# Patient Record
Sex: Male | Born: 1955 | Race: White | Hispanic: No | Marital: Married | Smoking: Never smoker
Health system: Southern US, Community
[De-identification: ages and names within clinical notes are randomized; demographics above are authoritative.]

## PROBLEM LIST (undated history)

## (undated) DIAGNOSIS — I7121 Aneurysm of the ascending aorta, without rupture: Secondary | ICD-10-CM

## (undated) DIAGNOSIS — G4730 Sleep apnea, unspecified: Secondary | ICD-10-CM

## (undated) DIAGNOSIS — I10 Essential (primary) hypertension: Secondary | ICD-10-CM

## (undated) DIAGNOSIS — E785 Hyperlipidemia, unspecified: Secondary | ICD-10-CM

## (undated) DIAGNOSIS — Z87442 Personal history of urinary calculi: Secondary | ICD-10-CM

## (undated) DIAGNOSIS — I712 Thoracic aortic aneurysm, without rupture: Secondary | ICD-10-CM

## (undated) HISTORY — PX: SHOULDER SURGERY: SHX246

## (undated) HISTORY — PX: TONSILLECTOMY: SUR1361

## (undated) HISTORY — PX: OTHER SURGICAL HISTORY: SHX169

## (undated) HISTORY — DX: Hyperlipidemia, unspecified: E78.5

## (undated) HISTORY — DX: Aneurysm of the ascending aorta, without rupture: I71.21

## (undated) HISTORY — DX: Thoracic aortic aneurysm, without rupture: I71.2

## (undated) HISTORY — DX: Essential (primary) hypertension: I10

---

## 2017-09-10 DIAGNOSIS — M79675 Pain in left toe(s): Secondary | ICD-10-CM | POA: Diagnosis not present

## 2017-09-10 DIAGNOSIS — L02612 Cutaneous abscess of left foot: Secondary | ICD-10-CM | POA: Diagnosis not present

## 2017-09-10 DIAGNOSIS — L03032 Cellulitis of left toe: Secondary | ICD-10-CM | POA: Diagnosis not present

## 2017-09-25 DIAGNOSIS — L03032 Cellulitis of left toe: Secondary | ICD-10-CM | POA: Diagnosis not present

## 2017-10-16 DIAGNOSIS — D229 Melanocytic nevi, unspecified: Secondary | ICD-10-CM | POA: Diagnosis not present

## 2017-10-16 DIAGNOSIS — L821 Other seborrheic keratosis: Secondary | ICD-10-CM | POA: Diagnosis not present

## 2017-10-16 DIAGNOSIS — Z1283 Encounter for screening for malignant neoplasm of skin: Secondary | ICD-10-CM | POA: Diagnosis not present

## 2017-10-16 DIAGNOSIS — L578 Other skin changes due to chronic exposure to nonionizing radiation: Secondary | ICD-10-CM | POA: Diagnosis not present

## 2017-10-17 DIAGNOSIS — M5432 Sciatica, left side: Secondary | ICD-10-CM | POA: Diagnosis not present

## 2017-11-14 DIAGNOSIS — M545 Low back pain: Secondary | ICD-10-CM | POA: Diagnosis not present

## 2017-11-14 DIAGNOSIS — M5432 Sciatica, left side: Secondary | ICD-10-CM | POA: Diagnosis not present

## 2017-11-14 DIAGNOSIS — M5416 Radiculopathy, lumbar region: Secondary | ICD-10-CM | POA: Diagnosis not present

## 2017-11-14 DIAGNOSIS — S330XXD Traumatic rupture of lumbar intervertebral disc, subsequent encounter: Secondary | ICD-10-CM | POA: Diagnosis not present

## 2017-11-19 DIAGNOSIS — M545 Low back pain: Secondary | ICD-10-CM | POA: Diagnosis not present

## 2017-11-21 DIAGNOSIS — M5416 Radiculopathy, lumbar region: Secondary | ICD-10-CM | POA: Diagnosis not present

## 2017-11-21 DIAGNOSIS — S330XXD Traumatic rupture of lumbar intervertebral disc, subsequent encounter: Secondary | ICD-10-CM | POA: Diagnosis not present

## 2017-11-21 DIAGNOSIS — M545 Low back pain: Secondary | ICD-10-CM | POA: Diagnosis not present

## 2017-11-23 DIAGNOSIS — M5416 Radiculopathy, lumbar region: Secondary | ICD-10-CM | POA: Diagnosis not present

## 2017-11-23 DIAGNOSIS — S330XXD Traumatic rupture of lumbar intervertebral disc, subsequent encounter: Secondary | ICD-10-CM | POA: Diagnosis not present

## 2017-11-23 DIAGNOSIS — M545 Low back pain: Secondary | ICD-10-CM | POA: Diagnosis not present

## 2017-11-27 DIAGNOSIS — M545 Low back pain: Secondary | ICD-10-CM | POA: Diagnosis not present

## 2017-11-27 DIAGNOSIS — M5416 Radiculopathy, lumbar region: Secondary | ICD-10-CM | POA: Diagnosis not present

## 2017-11-27 DIAGNOSIS — S330XXD Traumatic rupture of lumbar intervertebral disc, subsequent encounter: Secondary | ICD-10-CM | POA: Diagnosis not present

## 2017-11-29 DIAGNOSIS — M5416 Radiculopathy, lumbar region: Secondary | ICD-10-CM | POA: Diagnosis not present

## 2017-11-29 DIAGNOSIS — S330XXD Traumatic rupture of lumbar intervertebral disc, subsequent encounter: Secondary | ICD-10-CM | POA: Diagnosis not present

## 2017-11-29 DIAGNOSIS — M545 Low back pain: Secondary | ICD-10-CM | POA: Diagnosis not present

## 2017-12-03 DIAGNOSIS — M5416 Radiculopathy, lumbar region: Secondary | ICD-10-CM | POA: Diagnosis not present

## 2017-12-03 DIAGNOSIS — S330XXD Traumatic rupture of lumbar intervertebral disc, subsequent encounter: Secondary | ICD-10-CM | POA: Diagnosis not present

## 2017-12-03 DIAGNOSIS — M545 Low back pain: Secondary | ICD-10-CM | POA: Diagnosis not present

## 2017-12-06 DIAGNOSIS — M5416 Radiculopathy, lumbar region: Secondary | ICD-10-CM | POA: Diagnosis not present

## 2017-12-06 DIAGNOSIS — M545 Low back pain: Secondary | ICD-10-CM | POA: Diagnosis not present

## 2017-12-06 DIAGNOSIS — S330XXD Traumatic rupture of lumbar intervertebral disc, subsequent encounter: Secondary | ICD-10-CM | POA: Diagnosis not present

## 2017-12-10 DIAGNOSIS — M5416 Radiculopathy, lumbar region: Secondary | ICD-10-CM | POA: Diagnosis not present

## 2017-12-10 DIAGNOSIS — M545 Low back pain: Secondary | ICD-10-CM | POA: Diagnosis not present

## 2017-12-10 DIAGNOSIS — S330XXD Traumatic rupture of lumbar intervertebral disc, subsequent encounter: Secondary | ICD-10-CM | POA: Diagnosis not present

## 2017-12-11 DIAGNOSIS — M545 Low back pain: Secondary | ICD-10-CM | POA: Diagnosis not present

## 2017-12-11 DIAGNOSIS — M5416 Radiculopathy, lumbar region: Secondary | ICD-10-CM | POA: Diagnosis not present

## 2017-12-11 DIAGNOSIS — M25552 Pain in left hip: Secondary | ICD-10-CM | POA: Diagnosis not present

## 2017-12-20 DIAGNOSIS — S330XXD Traumatic rupture of lumbar intervertebral disc, subsequent encounter: Secondary | ICD-10-CM | POA: Diagnosis not present

## 2017-12-20 DIAGNOSIS — M545 Low back pain: Secondary | ICD-10-CM | POA: Diagnosis not present

## 2017-12-20 DIAGNOSIS — M5416 Radiculopathy, lumbar region: Secondary | ICD-10-CM | POA: Diagnosis not present

## 2017-12-27 DIAGNOSIS — S330XXD Traumatic rupture of lumbar intervertebral disc, subsequent encounter: Secondary | ICD-10-CM | POA: Diagnosis not present

## 2017-12-27 DIAGNOSIS — M5416 Radiculopathy, lumbar region: Secondary | ICD-10-CM | POA: Diagnosis not present

## 2017-12-27 DIAGNOSIS — M545 Low back pain: Secondary | ICD-10-CM | POA: Diagnosis not present

## 2018-01-02 DIAGNOSIS — E785 Hyperlipidemia, unspecified: Secondary | ICD-10-CM | POA: Diagnosis not present

## 2018-01-02 DIAGNOSIS — Z0000 Encounter for general adult medical examination without abnormal findings: Secondary | ICD-10-CM | POA: Diagnosis not present

## 2018-01-02 DIAGNOSIS — Z125 Encounter for screening for malignant neoplasm of prostate: Secondary | ICD-10-CM | POA: Diagnosis not present

## 2018-01-02 DIAGNOSIS — R7301 Impaired fasting glucose: Secondary | ICD-10-CM | POA: Diagnosis not present

## 2018-01-03 DIAGNOSIS — Z0000 Encounter for general adult medical examination without abnormal findings: Secondary | ICD-10-CM | POA: Diagnosis not present

## 2018-01-03 DIAGNOSIS — E785 Hyperlipidemia, unspecified: Secondary | ICD-10-CM | POA: Diagnosis not present

## 2018-01-03 DIAGNOSIS — I10 Essential (primary) hypertension: Secondary | ICD-10-CM | POA: Diagnosis not present

## 2018-01-03 DIAGNOSIS — R7301 Impaired fasting glucose: Secondary | ICD-10-CM | POA: Diagnosis not present

## 2018-02-24 DIAGNOSIS — R05 Cough: Secondary | ICD-10-CM | POA: Diagnosis not present

## 2018-02-24 DIAGNOSIS — R6889 Other general symptoms and signs: Secondary | ICD-10-CM | POA: Diagnosis not present

## 2018-02-24 DIAGNOSIS — J029 Acute pharyngitis, unspecified: Secondary | ICD-10-CM | POA: Diagnosis not present

## 2018-10-09 DIAGNOSIS — I712 Thoracic aortic aneurysm, without rupture: Secondary | ICD-10-CM | POA: Diagnosis not present

## 2018-10-24 DIAGNOSIS — Z23 Encounter for immunization: Secondary | ICD-10-CM | POA: Diagnosis not present

## 2018-12-05 DIAGNOSIS — E669 Obesity, unspecified: Secondary | ICD-10-CM | POA: Diagnosis not present

## 2018-12-05 DIAGNOSIS — G4733 Obstructive sleep apnea (adult) (pediatric): Secondary | ICD-10-CM | POA: Diagnosis not present

## 2018-12-05 DIAGNOSIS — Z1331 Encounter for screening for depression: Secondary | ICD-10-CM | POA: Diagnosis not present

## 2018-12-05 DIAGNOSIS — E7849 Other hyperlipidemia: Secondary | ICD-10-CM | POA: Diagnosis not present

## 2018-12-05 DIAGNOSIS — I10 Essential (primary) hypertension: Secondary | ICD-10-CM | POA: Diagnosis not present

## 2018-12-31 DIAGNOSIS — E7849 Other hyperlipidemia: Secondary | ICD-10-CM | POA: Diagnosis not present

## 2018-12-31 DIAGNOSIS — Z125 Encounter for screening for malignant neoplasm of prostate: Secondary | ICD-10-CM | POA: Diagnosis not present

## 2018-12-31 DIAGNOSIS — Z0000 Encounter for general adult medical examination without abnormal findings: Secondary | ICD-10-CM | POA: Diagnosis not present

## 2019-01-07 DIAGNOSIS — R002 Palpitations: Secondary | ICD-10-CM | POA: Diagnosis not present

## 2019-01-07 DIAGNOSIS — I10 Essential (primary) hypertension: Secondary | ICD-10-CM | POA: Diagnosis not present

## 2019-01-07 DIAGNOSIS — Z0000 Encounter for general adult medical examination without abnormal findings: Secondary | ICD-10-CM | POA: Diagnosis not present

## 2019-01-07 DIAGNOSIS — I719 Aortic aneurysm of unspecified site, without rupture: Secondary | ICD-10-CM | POA: Diagnosis not present

## 2019-01-07 DIAGNOSIS — G4733 Obstructive sleep apnea (adult) (pediatric): Secondary | ICD-10-CM | POA: Diagnosis not present

## 2019-01-08 ENCOUNTER — Ambulatory Visit (INDEPENDENT_AMBULATORY_CARE_PROVIDER_SITE_OTHER): Payer: BC Managed Care – PPO | Admitting: Cardiology

## 2019-01-08 ENCOUNTER — Other Ambulatory Visit: Payer: Self-pay

## 2019-01-08 VITALS — BP 152/86 | HR 70 | Temp 98.8°F | Ht 70.0 in | Wt 231.0 lb

## 2019-01-08 DIAGNOSIS — I712 Thoracic aortic aneurysm, without rupture: Secondary | ICD-10-CM

## 2019-01-08 DIAGNOSIS — I700 Atherosclerosis of aorta: Secondary | ICD-10-CM

## 2019-01-08 DIAGNOSIS — I10 Essential (primary) hypertension: Secondary | ICD-10-CM | POA: Diagnosis not present

## 2019-01-08 DIAGNOSIS — I7121 Aneurysm of the ascending aorta, without rupture: Secondary | ICD-10-CM

## 2019-01-08 MED ORDER — ATENOLOL 50 MG PO TABS: 50.0000 mg | ORAL_TABLET | Freq: Every day | ORAL | 3 refills | Status: DC

## 2019-01-08 NOTE — Progress Notes (Signed)
Patient referred by Charlane Ferretti, DO for hypertension  Subjective:   Alejandro Mccullough, male    DOB: Oct 23, 1955, 63 y.o.   MRN: [id number]   Chief Complaint  Patient presents with  . New Patient (Initial Visit)  . Hypertension    HPI  63 y.o. Caucasian male with thoracic aorta aneurysm, hypertension.  In 2016, patient was diagnosed with thoracic aortic aneurysm during work-up for palpitations.  He has regular follow-up with cardiothoracic surgery at [facility]  He recently underwent CT scan that showed stable ectatic thoracic aorta aneurysm measuring 4.8 cm.  Patient is a retired Recruitment consultant at [employer]  He moved from [place] to [place] in 2018. He exercises regularly, with walking, bicycle, and weight training with 20 lb dumbbells. He denies chest pain, shortness of breath, palpitations, leg edema, orthopnea, PND, TIA/syncope.    Past Medical History:  Diagnosis Date  . Aneurysm of ascending aorta (HCC)   . Hypertension     Past Surgical History:  Procedure Laterality Date  . other     None     Social History   Socioeconomic History  . Marital status: Not on file    Spouse name: Not on file  . Number of children: Not on file  . Years of education: Not on file  . Highest education level: Not on file  Occupational History  . Not on file  Social Needs  . Financial resource strain: Not on file  . Food insecurity    Worry: Not on file    Inability: Not on file  . Transportation needs    Medical: Not on file    Non-medical: Not on file  Tobacco Use  . Smoking status: Not on file  Substance and Sexual Activity  . Alcohol use: Not on file  . Drug use: Not on file  . Sexual activity: Not on file  Lifestyle  . Physical activity    Days per week: Not on file    Minutes per session: Not on file  . Stress: Not on file  Relationships  . Social Musician on phone: Not on file    Gets together: Not on file    Attends religious  service: Not on file    Active member of club or organization: Not on file    Attends meetings of clubs or organizations: Not on file    Relationship status: Not on file  . Intimate partner violence    Fear of current or ex partner: Not on file    Emotionally abused: Not on file    Physically abused: Not on file    Forced sexual activity: Not on file  Other Topics Concern  . Not on file  Social History Narrative  . Not on file     Family History  Problem Relation Age of Onset  . Aortic aneurysm Neg Hx      Current Outpatient Medications on File Prior to Visit  Medication Sig Dispense Refill  . aspirin EC 81 MG tablet Take 81 mg by mouth daily.    Marland Kitchen atorvastatin (LIPITOR) 40 MG tablet daily.    . Cholecalciferol (VITAMIN D3) 125 MCG (5000 UT) TABS Take by mouth daily.    Marland Kitchen losartan (COZAAR) 100 MG tablet Take 100 mg by mouth daily.    . Methylcellulose, Laxative, (CITRUCEL) 500 MG TABS Take 2 tablets by mouth daily. Citrucel    . triamcinolone cream (KENALOG) 0.1 % as needed.  No current facility-administered medications on file prior to visit.     Cardiovascular studies:  EKG 01/08/2019: Sinus rhythm 72 bpm.  Incomplete right bundle branch block. Left anterior fascicular block.  Low voltage limb leads.  CTA chest 10/09/2018: Heart/vessels: Normal heart size. Mild coronary arterial calcifications. Aortic valve calcifications. No pericardial effusion. 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 Aortic measurements as follows compared to CT 10/11/2016;  Ascending aorta and the bifurcation of pulmonary artery measures 4.5 cm, ectatic and unchanged Transverse aorta at the brachycephalic branch measures 3.5 cm, unchanged Proximal descending aorta just 2.8 cm, unchanged Distal descending aorta measures 2.5 cm, unchanged. Conclusion: Similar ectatic ascending aorta measuring up to 4.5 cm compared to CT 10/11/2016. Consider  continued annual imaging surveillance.   Recent labs: Not available.  Review of Systems  Constitution: Negative for decreased appetite, malaise/fatigue, weight gain and weight loss.  HENT: Negative for congestion.   Eyes: Negative for visual disturbance.  Cardiovascular: Negative for chest pain, dyspnea on exertion, leg swelling, palpitations and syncope.  Respiratory: Negative for cough.   Endocrine: Negative for cold intolerance.  Hematologic/Lymphatic: Does not bruise/bleed easily.  Skin: Negative for itching and rash.  Musculoskeletal: Negative for myalgias.  Gastrointestinal: Negative for abdominal pain, nausea and vomiting.  Genitourinary: Negative for dysuria.  Neurological: Negative for dizziness and weakness.  Psychiatric/Behavioral: The patient is not nervous/anxious.   All other systems reviewed and are negative.        Vitals:   01/08/19 1502  Temp: 98.8 F (37.1 C)     Body mass index is 33.15 kg/m. Filed Weights   01/08/19 1502  Weight: 231 lb (104.8 kg)     Objective:   Physical Exam  Constitutional: He is oriented to person, place, and time. He appears well-developed and well-nourished. No distress.  HENT:  Head: Normocephalic and atraumatic.  Eyes: Pupils are equal, round, and reactive to light. Conjunctivae are normal.  Neck: No JVD present.  Cardiovascular: Normal rate, regular rhythm and intact distal pulses.  No murmur heard. Pulmonary/Chest: Effort normal and breath sounds normal. He has no wheezes. He has no rales.  Abdominal: Soft. Bowel sounds are normal. There is no rebound.  Musculoskeletal:        General: No edema.  Lymphadenopathy:    He has no cervical adenopathy.  Neurological: He is alert and oriented to person, place, and time. No cranial nerve deficit.  Skin: Skin is warm and dry.  Psychiatric: He has a normal mood and affect.  Nursing note and vitals reviewed.         Assessment & Recommendations:   63 y.o.  Caucasian male with thoracic aorta aneurysm, hypertension.  1. Aneurysm of ascending aorta (HCC) 4.8 cm, stable. Conitnue medical management.  See below. No clear family h/o or physical exam features s/o familial aortopathy. Nonetheless, recommend genetic screening to exclude this.   2. Essential hypertension Continue losartan 100 mg daily. Instead of amlodipine, I recommend atenolol for HR, BP control, and delay progression of aneurysm. 50 mg daily. Avoid weight training >30 lb.  3. Aortic atherosclerosis: Continue Aspirin, statin. Thank you for referring the patient to [place]. Please feel free to contact with any questions.  Nigel Mormon, MD [facility] Pager: [phone] Office: [phone]

## 2019-01-09 ENCOUNTER — Encounter: Payer: Self-pay | Admitting: Cardiology

## 2019-01-09 DIAGNOSIS — I700 Atherosclerosis of aorta: Secondary | ICD-10-CM | POA: Insufficient documentation

## 2019-01-09 DIAGNOSIS — I712 Thoracic aortic aneurysm, without rupture, unspecified: Secondary | ICD-10-CM | POA: Insufficient documentation

## 2019-01-09 DIAGNOSIS — I10 Essential (primary) hypertension: Secondary | ICD-10-CM | POA: Insufficient documentation

## 2019-01-17 ENCOUNTER — Ambulatory Visit (INDEPENDENT_AMBULATORY_CARE_PROVIDER_SITE_OTHER): Payer: BC Managed Care – PPO | Admitting: Cardiology

## 2019-01-17 ENCOUNTER — Other Ambulatory Visit: Payer: Self-pay

## 2019-01-17 ENCOUNTER — Encounter: Payer: Self-pay | Admitting: Cardiology

## 2019-01-17 VITALS — BP 151/68 | HR 68 | Temp 97.0°F | Ht 70.0 in | Wt 227.9 lb

## 2019-01-17 DIAGNOSIS — I712 Thoracic aortic aneurysm, without rupture: Secondary | ICD-10-CM

## 2019-01-17 DIAGNOSIS — I7121 Aneurysm of the ascending aorta, without rupture: Secondary | ICD-10-CM

## 2019-01-17 NOTE — Progress Notes (Signed)
Patient here for genetic testing only 

## 2019-04-11 ENCOUNTER — Ambulatory Visit: Payer: BC Managed Care – PPO | Admitting: Cardiology

## 2019-04-11 ENCOUNTER — Other Ambulatory Visit: Payer: Self-pay

## 2019-04-11 ENCOUNTER — Encounter: Payer: Self-pay | Admitting: Cardiology

## 2019-04-11 VITALS — BP 153/74 | HR 57 | Temp 97.2°F | Ht 70.0 in | Wt 234.6 lb

## 2019-04-11 DIAGNOSIS — I10 Essential (primary) hypertension: Secondary | ICD-10-CM

## 2019-04-11 DIAGNOSIS — I712 Thoracic aortic aneurysm, without rupture, unspecified: Secondary | ICD-10-CM

## 2019-04-11 DIAGNOSIS — I700 Atherosclerosis of aorta: Secondary | ICD-10-CM | POA: Diagnosis not present

## 2019-04-11 MED ORDER — ASPIRIN EC 81 MG PO TBEC: 81.0000 mg | DELAYED_RELEASE_TABLET | Freq: Every day | ORAL | 3 refills | Status: DC

## 2019-04-11 MED ORDER — ATENOLOL 50 MG PO TABS: 50.0000 mg | ORAL_TABLET | Freq: Every day | ORAL | 3 refills | Status: DC

## 2019-04-11 MED ORDER — ATORVASTATIN CALCIUM 40 MG PO TABS: 40.0000 mg | ORAL_TABLET | Freq: Every day | ORAL | 3 refills | Status: DC

## 2019-04-11 MED ORDER — AMLODIPINE BESYLATE 5 MG PO TABS: 5.0000 mg | ORAL_TABLET | Freq: Every day | ORAL | 3 refills | Status: DC

## 2019-04-11 MED ORDER — LOSARTAN POTASSIUM 100 MG PO TABS: 100.0000 mg | ORAL_TABLET | Freq: Every day | ORAL | 3 refills | Status: DC

## 2019-04-11 NOTE — Progress Notes (Signed)
Patient referred by Charlane Ferretti, DO for hypertension  Subjective:   Alejandro Mccullough, male    DOB: [birth year], 64 y.o.   MRN: [id number]   Chief Complaint  Patient presents with  . Hypertension  . aorta aneurysm  . Follow-up    3 month    HPI   64 y.o. Caucasian male with thoracic aorta aneurysm, hypertension, aortic atherosclerosis.  At last visit 6 months ago, I started the patient on atenolol for better blood pressure and heart rate control in the setting of known thoracic aortic aneurysm.  Also perform genetic screening to evaluate for familial aortopathy.  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  He does not have any symptoms related to this, although my screening is limited.  His blood pressure improved after starting atenolol.  However, over the last few weeks, he has noticed upward trend.   Initial consultation HPI 12/2018: In 2016, patient was diagnosed with thoracic aortic aneurysm during work-up for palpitations.  He has regular follow-up with cardiothoracic surgery at [facility].  He recently underwent CT scan that showed stable ectatic thoracic aorta aneurysm measuring 4.8 cm.  Patient is a retired Recruitment consultant at [employer]  He moved from [place] to [place] in 2018. He exercises regularly, with walking, bicycle, and weight training with 20 lb dumbbells. He denies chest pain, shortness of breath, palpitations, leg edema, orthopnea, PND, TIA/syncope.    Current Outpatient Medications on File Prior to Visit  Medication Sig Dispense Refill  . aspirin EC 81 MG tablet Take 81 mg by mouth daily.    Marland Kitchen atenolol (TENORMIN) 50 MG tablet Take 1 tablet (50 mg total) by mouth daily. 30 tablet 3  . atorvastatin (LIPITOR) 40 MG tablet daily.    . Cholecalciferol (VITAMIN D3) 125 MCG (5000 UT) TABS Take  by mouth daily.    Marland Kitchen losartan (COZAAR) 100 MG tablet Take 100 mg by mouth daily.    . Methylcellulose, Laxative, (CITRUCEL) 500 MG TABS Take 2 tablets by mouth daily. Citrucel    . triamcinolone cream (KENALOG) 0.1 % as needed.     No current facility-administered medications on file prior to visit.    Cardiovascular studies:  EKG 01/08/2019: Sinus rhythm 72 bpm.  Incomplete right bundle branch block. Left anterior fascicular block.  Low voltage limb leads.  CTA chest 10/09/2018: Heart/vessels: Normal heart size. Mild coronary arterial calcifications. Aortic valve calcifications. No pericardial effusion. 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 Aortic measurements as follows compared to CT 10/11/2016;  Ascending aorta and the bifurcation of pulmonary artery measures 4.5 cm, ectatic and unchanged Transverse aorta at the brachycephalic branch measures 3.5 cm, unchanged Proximal descending aorta just 2.8 cm, unchanged Distal descending aorta measures 2.5 cm, unchanged. Conclusion: Similar ectatic ascending aorta measuring up to 4.5 cm compared to CT 10/11/2016. Consider continued annual imaging surveillance.   Recent labs: Not available.  Review of Systems  Cardiovascular: Negative for chest pain, dyspnea on exertion, leg swelling, palpitations and syncope.         Vitals:   04/11/19 0923  BP: (!) 153/74  Pulse: (!) 57  Temp: (!) 97.2 F (36.2 C)  SpO2: 99%     Body mass index is 33.66 kg/m. Filed Weights   04/11/19 0923  Weight: 234 lb 9.6 oz (106.4 kg)  Objective:   Physical Exam  Constitutional: He appears well-developed and well-nourished.  Neck: No JVD present.  Cardiovascular: Normal rate, regular rhythm, normal heart sounds and intact distal pulses.  No murmur heard. Pulmonary/Chest: Effort normal and breath sounds normal. He has no wheezes. He has no rales.  Musculoskeletal:        General: No  edema.  Nursing note and vitals reviewed.         Assessment & Recommendations:   64 y.o. Caucasian male with thoracic aorta aneurysm, hypertension, aortic atherosclerosis  1. Aneurysm of ascending aorta (HCC) 4.8 cm, stable. Conitnue medical management.  No clear family h/o or physical exam features s/o familial aortopathy. Repeat CT angiogram aorta in August 2021. Genetic screening negative for familial aortopathy. 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  Offered referral to genetic counseling.  Patient would like to hold off for now.   2. Essential hypertension Suboptimal control.  Restarted amlodipine 5 mg daily.  Continue losartan 100 mg, atenolol 50 mg daily.    3. Aortic atherosclerosis: Continue Aspirin, statin. Check BMP and lipid panel in August 2021.  Follow-up in 6 months.  Nigel Mormon, MD [facility]. [place] Pager: [phone] Office: [phone]

## 2019-04-12 ENCOUNTER — Encounter: Payer: Self-pay | Admitting: Cardiology

## 2019-05-01 NOTE — Addendum Note (Signed)
Addended by: Elder Negus on: 05/01/2019 09:21 AM   Modules accepted: Orders

## 2019-08-29 ENCOUNTER — Emergency Department (HOSPITAL_COMMUNITY): Payer: BC Managed Care – PPO

## 2019-08-29 ENCOUNTER — Emergency Department (HOSPITAL_COMMUNITY)
Admission: EM | Admit: 2019-08-29 | Discharge: 2019-08-29 | Disposition: A | Discharge status: Home / Self Care | Payer: BC Managed Care – PPO | Attending: Emergency Medicine | Admitting: Emergency Medicine

## 2019-08-29 ENCOUNTER — Other Ambulatory Visit: Payer: Self-pay

## 2019-08-29 DIAGNOSIS — N132 Hydronephrosis with renal and ureteral calculous obstruction: Secondary | ICD-10-CM | POA: Diagnosis not present

## 2019-08-29 DIAGNOSIS — Z79899 Other long term (current) drug therapy: Secondary | ICD-10-CM | POA: Diagnosis not present

## 2019-08-29 DIAGNOSIS — R109 Unspecified abdominal pain: Secondary | ICD-10-CM | POA: Diagnosis not present

## 2019-08-29 DIAGNOSIS — K5730 Diverticulosis of large intestine without perforation or abscess without bleeding: Secondary | ICD-10-CM | POA: Diagnosis not present

## 2019-08-29 DIAGNOSIS — Z880 Allergy status to penicillin: Secondary | ICD-10-CM | POA: Insufficient documentation

## 2019-08-29 DIAGNOSIS — Z7982 Long term (current) use of aspirin: Secondary | ICD-10-CM | POA: Insufficient documentation

## 2019-08-29 DIAGNOSIS — K8080 Other cholelithiasis without obstruction: Secondary | ICD-10-CM | POA: Diagnosis not present

## 2019-08-29 DIAGNOSIS — I700 Atherosclerosis of aorta: Secondary | ICD-10-CM | POA: Diagnosis not present

## 2019-08-29 DIAGNOSIS — I10 Essential (primary) hypertension: Secondary | ICD-10-CM | POA: Insufficient documentation

## 2019-08-29 DIAGNOSIS — N200 Calculus of kidney: Secondary | ICD-10-CM | POA: Insufficient documentation

## 2019-08-29 LAB — COMPREHENSIVE METABOLIC PANEL
ALT: 44 U/L (ref 0–44)
AST: 40 U/L (ref 15–41)
Albumin: 4.5 g/dL (ref 3.5–5.0)
Alkaline Phosphatase: 95 U/L (ref 38–126)
Anion gap: 14 (ref 5–15)
BUN: 14 mg/dL (ref 8–23)
CO2: 20 mmol/L — ABNORMAL LOW (ref 22–32)
Calcium: 9.1 mg/dL (ref 8.9–10.3)
Chloride: 104 mmol/L (ref 98–111)
Creatinine, Ser: 1.2 mg/dL (ref 0.61–1.24)
GFR calc Af Amer: >60 mL/min (ref >60)
GFR calc non Af Amer: >60 mL/min (ref >60)
Glucose, Bld: 147 mg/dL — ABNORMAL HIGH (ref 70–99)
Potassium: 4.5 mmol/L (ref 3.5–5.1)
Sodium: 138 mmol/L (ref 135–145)
Total Bilirubin: 0.6 mg/dL (ref 0.3–1.2)
Total Protein: 7.2 g/dL (ref 6.5–8.1)

## 2019-08-29 LAB — LIPASE, BLOOD: Lipase: 30 U/L (ref 11–51)

## 2019-08-29 LAB — CBC
HCT: 45.1 % (ref 39.0–52.0)
Hemoglobin: 15.5 g/dL (ref 13.0–17.0)
MCH: 33.2 pg (ref 26.0–34.0)
MCHC: 34.4 g/dL (ref 30.0–36.0)
MCV: 96.6 fL (ref 80.0–100.0)
Platelets: 231 10*3/uL (ref 150–400)
RBC: 4.67 MIL/uL (ref 4.22–5.81)
RDW: 12.8 % (ref 11.5–15.5)
WBC: 9 10*3/uL (ref 4.0–10.5)
nRBC: 0 % (ref 0.0–0.2)

## 2019-08-29 LAB — URINALYSIS, ROUTINE W REFLEX MICROSCOPIC
Bacteria, UA: NONE SEEN
Bilirubin Urine: NEGATIVE
Glucose, UA: NEGATIVE mg/dL
Ketones, ur: NEGATIVE mg/dL
Leukocytes,Ua: NEGATIVE
Nitrite: NEGATIVE
Protein, ur: NEGATIVE mg/dL
Specific Gravity, Urine: 1.023 (ref 1.005–1.030)
pH: 5 (ref 5.0–8.0)

## 2019-08-29 MED ORDER — ONDANSETRON HCL 4 MG PO TABS
4.0000 mg | ORAL_TABLET | Freq: Four times a day (QID) | ORAL | 0 refills | Status: DC
Start: 2019-08-29 — End: 2019-10-24

## 2019-08-29 MED ORDER — TAMSULOSIN HCL 0.4 MG PO CAPS
0.4000 mg | ORAL_CAPSULE | Freq: Once | ORAL | Status: AC
  Administered 2019-08-29: 0.4 mg via ORAL
  Filled 2019-08-29: qty 1

## 2019-08-29 MED ORDER — SODIUM CHLORIDE 0.9% FLUSH
3.0000 mL | Freq: Once | INTRAVENOUS | Status: AC
  Administered 2019-08-29: 3 mL via INTRAVENOUS

## 2019-08-29 MED ORDER — MORPHINE SULFATE (PF) 4 MG/ML IV SOLN
4.0000 mg | Freq: Once | INTRAVENOUS | Status: AC
  Administered 2019-08-29: 4 mg via INTRAVENOUS
  Filled 2019-08-29: qty 1

## 2019-08-29 MED ORDER — TAMSULOSIN HCL 0.4 MG PO CAPS: 0.4000 mg | ORAL_CAPSULE | Freq: Every day | ORAL | 0 refills | Status: AC

## 2019-08-29 MED ORDER — ONDANSETRON 4 MG PO TBDP: 4.0000 mg | ORAL_TABLET | Freq: Once | ORAL | Status: DC | PRN

## 2019-08-29 MED ORDER — ONDANSETRON HCL 4 MG/2ML IJ SOLN
4.0000 mg | Freq: Once | INTRAMUSCULAR | Status: AC
  Administered 2019-08-29: 4 mg via INTRAVENOUS
  Filled 2019-08-29: qty 2

## 2019-08-29 MED ORDER — HYDROCODONE-ACETAMINOPHEN 5-325 MG PO TABS: 1.0000 | ORAL_TABLET | Freq: Four times a day (QID) | ORAL | 0 refills | Status: AC | PRN

## 2019-08-29 MED ORDER — KETOROLAC TROMETHAMINE 15 MG/ML IJ SOLN
15.0000 mg | Freq: Once | INTRAMUSCULAR | Status: AC
  Administered 2019-08-29: 15 mg via INTRAVENOUS
  Filled 2019-08-29: qty 1

## 2019-08-29 MED ORDER — SODIUM CHLORIDE 0.9 % IV BOLUS (SEPSIS)
1000.0000 mL | Freq: Once | INTRAVENOUS | Status: AC
  Administered 2019-08-29: 1000 mL via INTRAVENOUS

## 2019-08-29 NOTE — Discharge Instructions (Signed)
You have a 77mm x 13mm obstructing kidney stone on the right. We are giving you medications to help with pain and to help pass the stone. Call urology tomorrow to schedule follow up. Thank you for allowing me to care for you today. Please return to the emergency department if you have new or worsening symptoms. Take your medications as instructed.

## 2019-08-29 NOTE — ED Triage Notes (Signed)
Patient complaining of abdominal and back pain that started 1 am. Patient states he is having nausea and chills.

## 2019-08-29 NOTE — ED Provider Notes (Signed)
[facility]-EMERGENCY DEPT Provider Note   CSN: [id number] Arrival date & time: 08/29/19  0341     History Chief Complaint  Patient presents with  . Abdominal Pain    Alejandro Mccullough is a 64 y.o. male.  Patient is a 64 year old male who has past medical history of hypertension, hyperlipidemia, kidney stones, aortic aneurysm presenting to the emergency department for acute onset of right-sided flank pain radiating into the groin which woke him up from sleep this morning.  He reports that this feels similar to when he had kidney stones in the past.  Denies any fever, chills, dysuria,..  He is having nausea        Past Medical History:  Diagnosis Date  . Aneurysm of ascending aorta (HCC)   . Hyperlipidemia   . Hypertension     Patient Active Problem List   Diagnosis Date Noted  . Essential hypertension 01/09/2019  . Thoracic aortic aneurysm without rupture (HCC) 01/09/2019  . Aortic atherosclerosis (HCC) 01/09/2019    Past Surgical History:  Procedure Laterality Date  . other     None  . TONSILLECTOMY     at the age of 50       Family History  Problem Relation Age of Onset  . Hypertension Mother   . Aortic aneurysm Neg Hx     Social History   Tobacco Use  . Smoking status: Never Smoker  . Smokeless tobacco: Never Used  Vaping Use  . Vaping Use: Never used  Substance Use Topics  . Alcohol use: Yes    Comment: occ  . Drug use: Never    Home Medications Prior to Admission medications   Medication Sig Start Date End Date Taking? Authorizing Provider  amLODipine (NORVASC) 5 MG tablet Take 1 tablet (5 mg total) by mouth daily. 04/11/19 08/29/19 Yes Patwardhan, Anabel Bene, MD  aspirin EC 81 MG tablet Take 1 tablet (81 mg total) by mouth daily. 04/11/19  Yes Patwardhan, Manish J, MD  atenolol (TENORMIN) 50 MG tablet Take 1 tablet (50 mg total) by mouth daily. 04/11/19 08/29/19 Yes Patwardhan, Manish J, MD  atorvastatin (LIPITOR) 40 MG tablet Take 1  tablet (40 mg total) by mouth daily. 04/11/19  Yes Patwardhan, Manish J, MD  Cholecalciferol (VITAMIN D3) 125 MCG (5000 UT) TABS Take by mouth daily.   Yes [provider]  losartan (COZAAR) 100 MG tablet Take 1 tablet (100 mg total) by mouth daily. 04/11/19  Yes Patwardhan, Manish J, MD  triamcinolone cream (KENALOG) 0.1 % as needed. 01/07/19  Yes [provider]  HYDROcodone-acetaminophen (NORCO/VICODIN) 5-325 MG tablet Take 1 tablet by mouth every 6 (six) hours as needed for up to 2 days for severe pain. 08/29/19 08/31/19  Arlyn Dunning, PA-C  Methylcellulose, Laxative, (CITRUCEL) 500 MG TABS Take 2 tablets by mouth daily. Citrucel    [provider]  ondansetron (ZOFRAN) 4 MG tablet Take 1 tablet (4 mg total) by mouth every 6 (six) hours. 08/29/19   Arlyn Dunning, PA-C  tamsulosin (FLOMAX) 0.4 MG CAPS capsule Take 1 capsule (0.4 mg total) by mouth daily for 5 days. 08/29/19 09/03/19  Arlyn Dunning, PA-C    Allergies    Penicillin g  Review of Systems   Review of Systems  Constitutional: Negative for chills and fever.  Respiratory: Negative for cough and shortness of breath.   Cardiovascular: Negative for chest pain.  Gastrointestinal: Positive for abdominal pain and nausea. Negative for vomiting.  Genitourinary: Positive  for flank pain. Negative for difficulty urinating and dysuria.  Neurological: Negative for dizziness.  All other systems reviewed and are negative.   Physical Exam Updated Vital Signs BP (!) 169/86 (BP Location: Left Arm)   Pulse 73   Temp 97.9 F (36.6 C) (Oral)   Resp 16   Ht 5\' 10"  (1.778 m)   Wt 102.5 kg   SpO2 97%   BMI 32.43 kg/m   Physical Exam Vitals and nursing note reviewed.  Constitutional:      General: He is not in acute distress.    Appearance: Normal appearance. He is well-developed. He is diaphoretic. He is not ill-appearing or toxic-appearing.     Comments: Appears uncomfortable in pain  HENT:     Head:  Normocephalic.  Eyes:     Conjunctiva/sclera: Conjunctivae normal.  Pulmonary:     Effort: Pulmonary effort is normal.  Abdominal:     General: Bowel sounds are normal.     Tenderness: There is no abdominal tenderness. There is right CVA tenderness.  Skin:    General: Skin is warm.  Neurological:     Mental Status: He is alert.  Psychiatric:        Mood and Affect: Mood normal.     ED Results / Procedures / Treatments   Labs (all labs ordered are listed, but only abnormal results are displayed) Labs Reviewed  COMPREHENSIVE METABOLIC PANEL - Abnormal; Notable for the following components:      Result Value   CO2 20 (*)    Glucose, Bld 147 (*)    All other components within normal limits  URINALYSIS, ROUTINE W REFLEX MICROSCOPIC - Abnormal; Notable for the following components:   Hgb urine dipstick LARGE (*)    All other components within normal limits  LIPASE, BLOOD  CBC    EKG None  Radiology CT Renal Stone Study  Result Date: 08/29/2019 CLINICAL DATA:  Flank pain with kidney stone suspected EXAM: CT ABDOMEN AND PELVIS WITHOUT CONTRAST TECHNIQUE: Multidetector CT imaging of the abdomen and pelvis was performed following the standard protocol without IV contrast. COMPARISON:  None. FINDINGS: Lower chest: Partially covered coronary atherosclerosis. Mild subpleural reticulation which is dependent and likely atelectasis. Hepatobiliary: Segmental steatosis in the anterior right lobe liver.Calcified gallstone at the neck of the gallbladder. No evidence of acute inflammation. Pancreas: Unremarkable. Spleen: Unremarkable. Adrenals/Urinary Tract: Negative adrenals. Mild right hydronephrosis due to a proximal ureteral stone which measures 6 x 5 mm on coronal reformats. Three small left renal calculi based on coronal reformats, measuring up to 2 mm. Probable punctate right lower pole calculus. No left hydronephrosis. Unremarkable collapsed bladder. Stomach/Bowel: No obstruction. No  appendicitis. Left colonic diverticulosis Vascular/Lymphatic: No acute vascular abnormality. Atherosclerotic calcification of the aorta and iliacs. No mass or adenopathy. Reproductive:No pathologic findings. Other: No ascites or pneumoperitoneum. Musculoskeletal: No acute abnormalities. Degenerative facet spurring. IMPRESSION: 1. 6 x 5 mm proximal right ureteral calculus with mild hydronephrosis. 2. Small left renal calculi. 3. Cholelithiasis and left colonic diverticulosis. 4. Segmental hepatic steatosis. Electronically Signed   By: 10/30/2019 M.D.   On: 08/29/2019 05:02    Procedures Procedures (including critical care time)  Medications Ordered in ED Medications  ondansetron (ZOFRAN-ODT) disintegrating tablet 4 mg (has no administration in time range)  sodium chloride flush (NS) 0.9 % injection 3 mL (3 mLs Intravenous Given 08/29/19 0432)  ondansetron (ZOFRAN) injection 4 mg (4 mg Intravenous Given 08/29/19 0429)  sodium chloride 0.9 % bolus 1,000 mL (0 mLs  Intravenous Stopped 08/29/19 0531)  ketorolac (TORADOL) 15 MG/ML injection 15 mg (15 mg Intravenous Given 08/29/19 0430)  tamsulosin (FLOMAX) capsule 0.4 mg (0.4 mg Oral Given 08/29/19 0522)  morphine 4 MG/ML injection 4 mg (4 mg Intravenous Given 08/29/19 0522)    ED Course  I have reviewed the triage vital signs and the nursing notes.  Pertinent labs & imaging results that were available during my care of the patient were reviewed by me and considered in my medical decision making (see chart for details).  Clinical Course as of Aug 29 615  Fri Aug 29, 2019  [id number] Patient with hx of kidney stones presenting with R flank pain which woke him from his sleep. Workup reveals hematuria with signs of UTI. Normal renal function and white count. Ct scan with obstructing 46mmx5mm stone on the R. Patient improved with fluids, pain control. Advised to start flomax, pain meds, drink fluids and call urology tomorrow. Advised on strict return precautions    [KM]    Clinical Course User Index [KM] Jeral Pinch   MDM Rules/Calculators/A&P                          Based on review of vitals, medical screening exam, lab work and/or imaging, there does not appear to be an acute, emergent etiology for the patient's symptoms. Counseled pt on good return precautions and encouraged both PCP and ED follow-up as needed.  Prior to discharge, I also discussed incidental imaging findings with patient in detail and advised appropriate, recommended follow-up in detail.  Clinical Impression: 1. Kidney stone     Disposition: Discharge  Prior to providing a prescription for a controlled substance, I independently reviewed the patient's recent prescription history on the [place] Controlled Substance Reporting System. The patient had no recent or regular prescriptions and was deemed appropriate for a brief, less than 3 day prescription of narcotic for acute analgesia.  This note was prepared with assistance of Conservation officer, historic buildings. Occasional wrong-word or sound-a-like substitutions may have occurred due to the inherent limitations of voice recognition software.  Final Clinical Impression(s) / ED Diagnoses Final diagnoses:  Kidney stone    Rx / DC Orders ED Discharge Orders         Ordered    ondansetron (ZOFRAN) 4 MG tablet  Every 6 hours     Discontinue  Reprint     08/29/19 0519    tamsulosin (FLOMAX) 0.4 MG CAPS capsule  Daily     Discontinue  Reprint     08/29/19 0519    HYDROcodone-acetaminophen (NORCO/VICODIN) 5-325 MG tablet  Every 6 hours PRN     Discontinue  Reprint     08/29/19 0519           Arlyn Dunning, PA-C 08/29/19 0617    Nira Conn, MD 08/29/19 [phone]

## 2019-08-31 ENCOUNTER — Encounter (HOSPITAL_COMMUNITY)
Admission: EM | Disposition: A | Discharge status: Home / Self Care | Payer: Self-pay | Source: Home / Self Care | Attending: Emergency Medicine

## 2019-08-31 SURGERY — CYSTOURETEROSCOPY, WITH RETROGRADE PYELOGRAM AND STENT INSERTION
Anesthesia: General

## 2019-09-01 ENCOUNTER — Encounter (HOSPITAL_COMMUNITY)
Admission: EM | Disposition: A | Discharge status: Home / Self Care | Payer: Self-pay | Source: Home / Self Care | Attending: Emergency Medicine

## 2019-09-01 ENCOUNTER — Ambulatory Visit (HOSPITAL_COMMUNITY)
Admission: EM | Admit: 2019-09-01 | Discharge: 2019-09-02 | Disposition: A | Discharge status: Home / Self Care | Payer: BC Managed Care – PPO | Attending: Urology | Admitting: Urology

## 2019-09-01 ENCOUNTER — Emergency Department (HOSPITAL_COMMUNITY): Payer: BC Managed Care – PPO | Admitting: Anesthesiology

## 2019-09-01 ENCOUNTER — Emergency Department (HOSPITAL_COMMUNITY): Payer: BC Managed Care – PPO

## 2019-09-01 ENCOUNTER — Emergency Department (HOSPITAL_COMMUNITY): Payer: BC Managed Care – PPO | Admitting: Certified Registered"

## 2019-09-01 ENCOUNTER — Encounter (HOSPITAL_COMMUNITY): Payer: Self-pay

## 2019-09-01 ENCOUNTER — Other Ambulatory Visit: Payer: Self-pay

## 2019-09-01 DIAGNOSIS — R7989 Other specified abnormal findings of blood chemistry: Secondary | ICD-10-CM

## 2019-09-01 DIAGNOSIS — Z87442 Personal history of urinary calculi: Secondary | ICD-10-CM | POA: Diagnosis not present

## 2019-09-01 DIAGNOSIS — Z03818 Encounter for observation for suspected exposure to other biological agents ruled out: Secondary | ICD-10-CM | POA: Diagnosis not present

## 2019-09-01 DIAGNOSIS — N201 Calculus of ureter: Secondary | ICD-10-CM | POA: Diagnosis not present

## 2019-09-01 DIAGNOSIS — N179 Acute kidney failure, unspecified: Secondary | ICD-10-CM | POA: Diagnosis not present

## 2019-09-01 DIAGNOSIS — I700 Atherosclerosis of aorta: Secondary | ICD-10-CM | POA: Diagnosis not present

## 2019-09-01 DIAGNOSIS — I10 Essential (primary) hypertension: Secondary | ICD-10-CM | POA: Insufficient documentation

## 2019-09-01 DIAGNOSIS — Z880 Allergy status to penicillin: Secondary | ICD-10-CM | POA: Insufficient documentation

## 2019-09-01 DIAGNOSIS — R109 Unspecified abdominal pain: Secondary | ICD-10-CM | POA: Diagnosis not present

## 2019-09-01 DIAGNOSIS — E785 Hyperlipidemia, unspecified: Secondary | ICD-10-CM | POA: Diagnosis not present

## 2019-09-01 DIAGNOSIS — I712 Thoracic aortic aneurysm, without rupture: Secondary | ICD-10-CM | POA: Insufficient documentation

## 2019-09-01 DIAGNOSIS — Z7982 Long term (current) use of aspirin: Secondary | ICD-10-CM | POA: Insufficient documentation

## 2019-09-01 DIAGNOSIS — K8020 Calculus of gallbladder without cholecystitis without obstruction: Secondary | ICD-10-CM | POA: Insufficient documentation

## 2019-09-01 DIAGNOSIS — Z20822 Contact with and (suspected) exposure to covid-19: Secondary | ICD-10-CM | POA: Insufficient documentation

## 2019-09-01 DIAGNOSIS — N132 Hydronephrosis with renal and ureteral calculous obstruction: Secondary | ICD-10-CM | POA: Diagnosis not present

## 2019-09-01 DIAGNOSIS — Z8249 Family history of ischemic heart disease and other diseases of the circulatory system: Secondary | ICD-10-CM | POA: Diagnosis not present

## 2019-09-01 DIAGNOSIS — Z79899 Other long term (current) drug therapy: Secondary | ICD-10-CM | POA: Insufficient documentation

## 2019-09-01 DIAGNOSIS — K760 Fatty (change of) liver, not elsewhere classified: Secondary | ICD-10-CM | POA: Diagnosis not present

## 2019-09-01 HISTORY — PX: CYSTOSCOPY WITH RETROGRADE PYELOGRAM, URETEROSCOPY AND STENT PLACEMENT: SHX5789

## 2019-09-01 LAB — CBC WITH DIFFERENTIAL/PLATELET
Abs Immature Granulocytes: 0.03 10*3/uL (ref 0.00–0.07)
Basophils Absolute: 0 10*3/uL (ref 0.0–0.1)
Basophils Relative: 0 %
Eosinophils Absolute: 0 10*3/uL (ref 0.0–0.5)
Eosinophils Relative: 0 %
HCT: 45.4 % (ref 39.0–52.0)
Hemoglobin: 15.4 g/dL (ref 13.0–17.0)
Immature Granulocytes: 0 %
Lymphocytes Relative: 14 %
Lymphs Abs: 1.5 10*3/uL (ref 0.7–4.0)
MCH: 33.8 pg (ref 26.0–34.0)
MCHC: 33.9 g/dL (ref 30.0–36.0)
MCV: 99.6 fL (ref 80.0–100.0)
Monocytes Absolute: 1.1 10*3/uL — ABNORMAL HIGH (ref 0.1–1.0)
Monocytes Relative: 10 %
Neutro Abs: 7.9 10*3/uL — ABNORMAL HIGH (ref 1.7–7.7)
Neutrophils Relative %: 76 %
Platelets: 237 10*3/uL (ref 150–400)
RBC: 4.56 MIL/uL (ref 4.22–5.81)
RDW: 12.4 % (ref 11.5–15.5)
WBC: 10.5 10*3/uL (ref 4.0–10.5)
nRBC: 0 % (ref 0.0–0.2)

## 2019-09-01 LAB — URINALYSIS, ROUTINE W REFLEX MICROSCOPIC
Bilirubin Urine: NEGATIVE
Glucose, UA: NEGATIVE mg/dL
Ketones, ur: NEGATIVE mg/dL
Nitrite: NEGATIVE
Protein, ur: NEGATIVE mg/dL
RBC / HPF: >50 RBC/hpf — ABNORMAL HIGH (ref 0–5)
Specific Gravity, Urine: 1.016 (ref 1.005–1.030)
pH: 5 (ref 5.0–8.0)

## 2019-09-01 LAB — BASIC METABOLIC PANEL
Anion gap: 10 (ref 5–15)
BUN: 16 mg/dL (ref 8–23)
CO2: 25 mmol/L (ref 22–32)
Calcium: 9.3 mg/dL (ref 8.9–10.3)
Chloride: 103 mmol/L (ref 98–111)
Creatinine, Ser: 1.95 mg/dL — ABNORMAL HIGH (ref 0.61–1.24)
GFR calc Af Amer: 42 mL/min — ABNORMAL LOW (ref >60)
GFR calc non Af Amer: 36 mL/min — ABNORMAL LOW (ref >60)
Glucose, Bld: 101 mg/dL — ABNORMAL HIGH (ref 70–99)
Potassium: 4.5 mmol/L (ref 3.5–5.1)
Sodium: 138 mmol/L (ref 135–145)

## 2019-09-01 LAB — SARS CORONAVIRUS 2 BY RT PCR (HOSPITAL ORDER, PERFORMED IN ~~LOC~~ HOSPITAL LAB): SARS Coronavirus 2: NEGATIVE

## 2019-09-01 SURGERY — CYSTOURETEROSCOPY, WITH RETROGRADE PYELOGRAM AND STENT INSERTION
Anesthesia: General | Site: Ureter | Laterality: Right

## 2019-09-01 MED ORDER — MORPHINE SULFATE (PF) 4 MG/ML IV SOLN
4.0000 mg | Freq: Once | INTRAVENOUS | Status: AC
  Administered 2019-09-01: 4 mg via INTRAVENOUS
  Filled 2019-09-01: qty 1

## 2019-09-01 MED ORDER — SODIUM CHLORIDE 0.9 % IV SOLN
2.0000 g | INTRAVENOUS | Status: AC
  Administered 2019-09-01: 2 g via INTRAVENOUS
  Filled 2019-09-01: qty 2

## 2019-09-01 MED ORDER — FENTANYL CITRATE (PF) 100 MCG/2ML IJ SOLN
INTRAMUSCULAR | Status: AC
  Filled 2019-09-01: qty 2

## 2019-09-01 MED ORDER — MIDAZOLAM HCL 2 MG/2ML IJ SOLN
INTRAMUSCULAR | Status: AC
  Filled 2019-09-01: qty 2

## 2019-09-01 MED ORDER — PROPOFOL 10 MG/ML IV BOLUS
INTRAVENOUS | Status: AC
  Filled 2019-09-01: qty 20

## 2019-09-01 MED ORDER — ONDANSETRON HCL 4 MG/2ML IJ SOLN
4.0000 mg | Freq: Once | INTRAMUSCULAR | Status: AC
  Administered 2019-09-01: 4 mg via INTRAVENOUS
  Filled 2019-09-01: qty 2

## 2019-09-01 MED ORDER — SODIUM CHLORIDE 0.9 % IV BOLUS
1000.0000 mL | Freq: Once | INTRAVENOUS | Status: AC
  Administered 2019-09-01: 1000 mL via INTRAVENOUS

## 2019-09-01 SURGICAL SUPPLY — 25 items
BAG URO CATCHER STRL LF (MISCELLANEOUS) ×3 IMPLANT
BASKET STONE NCOMPASS (UROLOGICAL SUPPLIES) IMPLANT
CATH URET 5FR 28IN OPEN ENDED (CATHETERS) ×3 IMPLANT
CATH URET DUAL LUMEN 6-10FR 50 (CATHETERS) IMPLANT
CLOTH BEACON ORANGE TIMEOUT ST (SAFETY) ×3 IMPLANT
EXTRACTOR STONE NITINOL NGAGE (UROLOGICAL SUPPLIES) IMPLANT
FIBER LASER FLEXIVA 1000 (UROLOGICAL SUPPLIES) IMPLANT
FIBER LASER FLEXIVA 365 (UROLOGICAL SUPPLIES) IMPLANT
FIBER LASER FLEXIVA 550 (UROLOGICAL SUPPLIES) IMPLANT
FIBER LASER TRAC TIP (UROLOGICAL SUPPLIES) IMPLANT
GLOVE BIOGEL PI IND STRL 7.5 (GLOVE) ×1 IMPLANT
GLOVE BIOGEL PI IND STRL 8 (GLOVE) ×2 IMPLANT
GLOVE BIOGEL PI INDICATOR 7.5 (GLOVE) ×2
GLOVE BIOGEL PI INDICATOR 8 (GLOVE) ×4
GLOVE SURG SS PI 8.0 STRL IVOR (GLOVE) ×3 IMPLANT
GOWN STRL REUS W/TWL XL LVL3 (GOWN DISPOSABLE) ×3 IMPLANT
GUIDEWIRE STR DUAL SENSOR (WIRE) ×6 IMPLANT
KIT TURNOVER KIT A (KITS) ×3 IMPLANT
MANIFOLD NEPTUNE II (INSTRUMENTS) ×3 IMPLANT
PACK CYSTO (CUSTOM PROCEDURE TRAY) ×3 IMPLANT
SHEATH URETERAL 12FRX35CM (MISCELLANEOUS) ×3 IMPLANT
STENT URET 6FRX26 CONTOUR (STENTS) ×3 IMPLANT
TUBING CONNECTING 10 (TUBING) ×2 IMPLANT
TUBING CONNECTING 10' (TUBING) ×1
TUBING UROLOGY SET (TUBING) ×3 IMPLANT

## 2019-09-01 NOTE — ED Triage Notes (Signed)
Patient ac/o right flank pain that is radiating into the right abdomen. Patient has a know kidney stone. Patient states he has been taking hydrocodone which has been working, but he took one at 1045 today with no relief. Patient denies any problems with urination.

## 2019-09-01 NOTE — H&P (View-Only) (Signed)
Subjective: 1. Ureteral stone with hydronephrosis   2. Elevated serum creatinine     CC: Right flank pain.  Hx: Alejandro Mccullough is a 64 yo male who I was asked to see in consultation by Dr. Darl Householder for a 60m right proximal stone with pain that began on Friday.  This is his second trip to the OR and the stone has not progressed.  He had nausea on Friday but none since.  He has had no hematuria or dysuria.  He has some frequency with hydration.  He has AKI with a Cr of 1.95.  He has a history of a prior stone that he passed in 2017.  He has had no prior GU history or surgery.   His pain is well controlled with pain med currently.   ROS:  Review of Systems  Constitutional: Negative for chills and fever.  Respiratory: Negative for shortness of breath.   Cardiovascular: Negative for chest pain.  Gastrointestinal: Positive for abdominal pain and nausea.  Genitourinary: Positive for flank pain and frequency. Negative for hematuria.  All other systems reviewed and are negative.   Allergies  Allergen Reactions   Penicillin G Rash    Past Medical History:  Diagnosis Date   Aneurysm of ascending aorta (HCC)    Hyperlipidemia    Hypertension    Renal disorder     Past Surgical History:  Procedure Laterality Date   other     None   TONSILLECTOMY     at the age of 89   Social History   Socioeconomic History   Marital status: Married    Spouse name: Not on file   Number of children: 1   Years of education: Not on file   Highest education level: Not on file  Occupational History   Not on file  Tobacco Use   Smoking status: Never Smoker   Smokeless tobacco: Never Used  Vaping Use   Vaping Use: Never used  Substance and Sexual Activity   Alcohol use: Yes    Comment: occ   Drug use: Never   Sexual activity: Not on file  Other Topics Concern   Not on file  Social History Narrative   Not on file   Social Determinants of Health   Financial Resource Strain:     Difficulty of Paying Living Expenses:   Food Insecurity:    Worried About RCharity fundraiserin the Last Year:    RArboriculturistin the Last Year:   Transportation Needs:    LFilm/video editor(Medical):    Lack of Transportation (Non-Medical):   Physical Activity:    Days of Exercise per Week:    Minutes of Exercise per Session:   Stress:    Feeling of Stress :   Social Connections:    Frequency of Communication with Friends and Family:    Frequency of Social Gatherings with Friends and Family:    Attends Religious Services:    Active Member of Clubs or Organizations:    Attends CMusic therapist    Marital Status:   Intimate Partner Violence:    Fear of Current or Ex-Partner:    Emotionally Abused:    Physically Abused:    Sexually Abused:     Family History  Problem Relation Age of Onset   Hypertension Mother    Aortic aneurysm Neg Hx     Anti-infectives: Anti-infectives (From admission, onward)   None      No  facility-administered medications for this encounter.  ° °Current Outpatient Medications  °Medication Sig Dispense Refill  °• amLODipine (NORVASC) 5 MG tablet Take 1 tablet (5 mg total) by mouth daily. 180 tablet 3  °• aspirin EC 81 MG tablet Take 1 tablet (81 mg total) by mouth daily. 90 tablet 3  °• atenolol (TENORMIN) 50 MG tablet Take 1 tablet (50 mg total) by mouth daily. 90 tablet 3  °• atorvastatin (LIPITOR) 40 MG tablet Take 1 tablet (40 mg total) by mouth daily. 90 tablet 3  °• Cholecalciferol (VITAMIN D3) 125 MCG (5000 UT) TABS Take by mouth daily.    °• HYDROcodone-acetaminophen (NORCO/VICODIN) 5-325 MG tablet Take 1 tablet by mouth every 6 (six) hours as needed for moderate pain.    °• losartan (COZAAR) 100 MG tablet Take 1 tablet (100 mg total) by mouth daily. 90 tablet 3  °• Methylcellulose, Laxative, (CITRUCEL) 500 MG TABS Take 2 tablets by mouth daily. Citrucel    °• ondansetron (ZOFRAN) 4 MG tablet Take 1  tablet (4 mg total) by mouth every 6 (six) hours. (Patient taking differently: Take 4 mg by mouth 4 (four) times daily as needed for nausea or vomiting. ) 12 tablet 0  °• tamsulosin (FLOMAX) 0.4 MG CAPS capsule Take 1 capsule (0.4 mg total) by mouth daily for 5 days. 5 capsule 0  °• triamcinolone cream (KENALOG) 0.1 % Apply 1 application topically as needed.     ° ° ° °Objective: °Vital signs in last 24 hours: °BP (!) 147/75    Pulse 65    Temp 98.3 °F (36.8 °C) (Oral)    Resp 15    Ht 5' 10" (1.778 m)    Wt 102.5 kg    SpO2 96%    BMI 32.43 kg/m²  ° °Intake/Output from previous day: °No intake/output data recorded. °Intake/Output this shift: °Total I/O °In: 1000 [IV Piggyback:1000] °Out: -  ° ° °Physical Exam °Vitals reviewed.  °Constitutional:   °   Appearance: Normal appearance. He is obese.  °Cardiovascular:  °   Rate and Rhythm: Normal rate and regular rhythm.  °   Heart sounds: Normal heart sounds.  °Pulmonary:  °   Effort: Pulmonary effort is normal. No respiratory distress.  °   Breath sounds: Normal breath sounds.  °Abdominal:  °   Palpations: Abdomen is soft.  °   Tenderness: There is right CVA tenderness.  °Musculoskeletal:     °   General: No swelling. Normal range of motion.  °   Cervical back: Normal range of motion and neck supple.  °Skin: °   General: Skin is warm and dry.  °Neurological:  °   General: No focal deficit present.  °   Mental Status: He is alert and oriented to person, place, and time.  °Psychiatric:     °   Mood and Affect: Mood normal.  ° ° ° °Lab Results:  °Results for orders placed or performed during the hospital encounter of 09/01/19 (from the past 24 hour(s))  °Urinalysis, Routine w reflex microscopic- may I&O cath if menses     Status: Abnormal  ° Collection Time: 09/01/19  4:18 PM  °Result Value Ref Range  ° Color, Urine YELLOW YELLOW  ° APPearance CLEAR CLEAR  ° Specific Gravity, Urine 1.016 1.005 - 1.030  ° pH 5.0 5.0 - 8.0  ° Glucose, UA NEGATIVE NEGATIVE mg/dL  ° Hgb urine  dipstick LARGE (A) NEGATIVE  ° Bilirubin Urine NEGATIVE NEGATIVE  ° Ketones, ur   NEGATIVE NEGATIVE mg/dL  ° Protein, ur NEGATIVE NEGATIVE mg/dL  ° Nitrite NEGATIVE NEGATIVE  ° Leukocytes,Ua TRACE (A) NEGATIVE  ° RBC / HPF >50 (H) 0 - 5 RBC/hpf  ° WBC, UA 6-10 0 - 5 WBC/hpf  ° Bacteria, UA RARE (A) NONE SEEN  ° Squamous Epithelial / LPF 0-5 0 - 5  ° Mucus PRESENT   °Basic metabolic panel     Status: Abnormal  ° Collection Time: 09/01/19  4:20 PM  °Result Value Ref Range  ° Sodium 138 135 - 145 mmol/L  ° Potassium 4.5 3.5 - 5.1 mmol/L  ° Chloride 103 98 - 111 mmol/L  ° CO2 25 22 - 32 mmol/L  ° Glucose, Bld 101 (H) 70 - 99 mg/dL  ° BUN 16 8 - 23 mg/dL  ° Creatinine, Ser 1.95 (H) 0.61 - 1.24 mg/dL  ° Calcium 9.3 8.9 - 10.3 mg/dL  ° GFR calc non Af Amer 36 (L) >60 mL/min  ° GFR calc Af Amer 42 (L) >60 mL/min  ° Anion gap 10 5 - 15  °CBC with Differential     Status: Abnormal  ° Collection Time: 09/01/19  4:20 PM  °Result Value Ref Range  ° WBC 10.5 4.0 - 10.5 K/uL  ° RBC 4.56 4.22 - 5.81 MIL/uL  ° Hemoglobin 15.4 13.0 - 17.0 g/dL  ° HCT 45.4 39 - 52 %  ° MCV 99.6 80.0 - 100.0 fL  ° MCH 33.8 26.0 - 34.0 pg  ° MCHC 33.9 30.0 - 36.0 g/dL  ° RDW 12.4 11.5 - 15.5 %  ° Platelets 237 150 - 400 K/uL  ° nRBC 0.0 0.0 - 0.2 %  ° Neutrophils Relative % 76 %  ° Neutro Abs 7.9 (H) 1.7 - 7.7 K/uL  ° Lymphocytes Relative 14 %  ° Lymphs Abs 1.5 0.7 - 4.0 K/uL  ° Monocytes Relative 10 %  ° Monocytes Absolute 1.1 (H) 0 - 1 K/uL  ° Eosinophils Relative 0 %  ° Eosinophils Absolute 0.0 0 - 0 K/uL  ° Basophils Relative 0 %  ° Basophils Absolute 0.0 0 - 0 K/uL  ° Immature Granulocytes 0 %  ° Abs Immature Granulocytes 0.03 0.00 - 0.07 K/uL  °  °BMET °Recent Labs  °  09/01/19 °1620  °NA 138  °K 4.5  °CL 103  °CO2 25  °GLUCOSE 101*  °BUN 16  °CREATININE 1.95*  °CALCIUM 9.3  ° °PT/INR °No results for input(s): LABPROT, INR in the last 72 hours. °ABG °No results for input(s): PHART, HCO3 in the last 72 hours. ° °Invalid input(s): PCO2,  PO2 ° °Studies/Results: °DG Abdomen 1 View ° °Result Date: 09/01/2019 °CLINICAL DATA:  Right-sided abdominal pain 3 days. Known recent right UPJ stone. EXAM: ABDOMEN - 1 VIEW COMPARISON:  CT 08/29/2019 FINDINGS: Bowel gas pattern demonstrates air throughout the colon. There are several air-filled mildly prominent but nondilated central small bowel loops no free Peri single gallstone over the right upper quadrant no definite renal stones. Patient's previously noted right UPJ stone not well visualized. Several pelvic phleboliths are present. Remainder of the exam is unchanged. IMPRESSION: 1.  Nonspecific, nonobstructive bowel gas pattern. 2.  Single gallstone. Electronically Signed   By: Daniel  Boyle M.D.   On: 09/01/2019 18:05  ° °CT Renal Stone Study ° °Result Date: 09/01/2019 °CLINICAL DATA:  Flank pain EXAM: CT ABDOMEN AND PELVIS WITHOUT CONTRAST TECHNIQUE: Multidetector CT imaging of the abdomen and pelvis was performed following the standard protocol without IV   contrast. COMPARISON:  CT abdomen pelvis dated 08/29/2019 FINDINGS: Lower chest: Minimal bibasilar atelectasis. Hepatobiliary: Hepatic steatosis is seen in the anterior aspect of the right hepatic lobe. A gallstone is seen in the gallbladder neck. No gallbladder wall thickening or biliary dilatation. Pancreas: Unremarkable. No pancreatic ductal dilatation or surrounding inflammatory changes. Spleen: Normal in size without focal abnormality. Adrenals/Urinary Tract: Adrenal glands are unremarkable. An obstructing 7 mm calculus in the proximal right ureter results in mild right hydronephrosis, unchanged from 08/29/2019. Surrounding periureteral fat stranding is increased. Punctate nonobstructive renal calculi are redemonstrated, 2 on the left and 1 on the right. There is no left hydronephrosis. Bladder is nondistended. Stomach/Bowel: Stomach is within normal limits. Appendix appears normal. No evidence of bowel wall thickening, distention, or inflammatory  changes. Vascular/Lymphatic: Aortic atherosclerosis. No enlarged abdominal or pelvic lymph nodes. Reproductive: Prostate is unremarkable. Other: No abdominal wall hernia or abnormality. No abdominopelvic ascites. Musculoskeletal: No acute or significant osseous findings. IMPRESSION: 1. Obstructing 7 mm calculus in the proximal right ureter results in mild right hydronephrosis, unchanged from 08/29/2019. Surrounding periureteral fat stranding is increased. Aortic Atherosclerosis (ICD10-I70.0). Electronically Signed   By: Tyler  Litton M.D.   On: 09/01/2019 19:12  ° °Case discussed with Dr. Yao.  Imaging and labs reviewed. ° °Assessment/Plan: °7mm right proximal stone with obstruction with pain and AKI.  I discussed ureteroscopy and stenting vs continued MET or ESWL.   He would like to proceed with ureteroscopy.   I have reviewed the risks of bleeding, infection, ureteral injury, need for secondary procedures, thrombotic events and anesthetic complications.   ° °  ° °  ° °No follow-ups on file.  ° ° °CC: Dr. David Yao and Dr. Austin Skakle.  ° ° ° °Alejandro Mccullough °09/01/2019 °[phone] ° °

## 2019-09-01 NOTE — ED Provider Notes (Signed)
[facility]-EMERGENCY DEPT Provider Note   CSN: [id number] Arrival date & time: 09/01/19  1257     History Chief Complaint  Patient presents with  . Flank Pain    Alejandro Mccullough is a 64 y.o. male.  Alejandro Mccullough is a 64 y.o. male with a history of hypertension, hyperlipidemia, and aortic aneurysm, who presents to the ED for evaluation of worsening right flank pain.  Patient was seen in the ED on 7/2 and diagnosed with a right-sided kidney stone.  He was discharged home with pain management and states he was doing well until this morning when he started having sharp pains on the right flank, took his prescribed hydrocodone but states this did not help with the pain at all and pain continued to worsen throughout the day.  He denies any associated nausea or vomiting.  Has not had any difficulty urinating, has not noted hematuria or dysuria.  Denies any fevers or chills.  He reports a constant ache over the flank with intermittent sharp and, worsening pains.  He has not passed anything at home over the past 4 days that he knows of.  Chart review shows that patient had a proximal ureteral stone measuring 6 x 5 mm on Friday.  Reports history of 1 previous kidney stone 4 years ago.        Past Medical History:  Diagnosis Date  . Aneurysm of ascending aorta (HCC)   . Hyperlipidemia   . Hypertension   . Renal disorder     Patient Active Problem List   Diagnosis Date Noted  . Essential hypertension 01/09/2019  . Thoracic aortic aneurysm without rupture (HCC) 01/09/2019  . Aortic atherosclerosis (HCC) 01/09/2019    Past Surgical History:  Procedure Laterality Date  . other     None  . TONSILLECTOMY     at the age of 34       Family History  Problem Relation Age of Onset  . Hypertension Mother   . Aortic aneurysm Neg Hx     Social History   Tobacco Use  . Smoking status: Never Smoker  . Smokeless tobacco: Never Used  Vaping Use  . Vaping Use: Never used    Substance Use Topics  . Alcohol use: Yes    Comment: occ  . Drug use: Never    Home Medications Prior to Admission medications   Medication Sig Start Date End Date Taking? Authorizing Provider  amLODipine (NORVASC) 5 MG tablet Take 1 tablet (5 mg total) by mouth daily. 04/11/19 09/01/19 Yes Patwardhan, Anabel Bene, MD  aspirin EC 81 MG tablet Take 1 tablet (81 mg total) by mouth daily. 04/11/19  Yes Patwardhan, Manish J, MD  atenolol (TENORMIN) 50 MG tablet Take 1 tablet (50 mg total) by mouth daily. 04/11/19 09/01/19 Yes Patwardhan, Manish J, MD  atorvastatin (LIPITOR) 40 MG tablet Take 1 tablet (40 mg total) by mouth daily. 04/11/19  Yes Patwardhan, Manish J, MD  Cholecalciferol (VITAMIN D3) 125 MCG (5000 UT) TABS Take by mouth daily.   Yes [provider]  HYDROcodone-acetaminophen (NORCO/VICODIN) 5-325 MG tablet Take 1 tablet by mouth every 6 (six) hours as needed for moderate pain.   Yes [provider]  losartan (COZAAR) 100 MG tablet Take 1 tablet (100 mg total) by mouth daily. 04/11/19  Yes Patwardhan, Manish J, MD  Methylcellulose, Laxative, (CITRUCEL) 500 MG TABS Take 2 tablets by mouth daily. Citrucel   Yes [provider]  ondansetron (ZOFRAN) 4 MG tablet  Take 1 tablet (4 mg total) by mouth every 6 (six) hours. Patient taking differently: Take 4 mg by mouth 4 (four) times daily as needed for nausea or vomiting.  08/29/19  Yes Ronnie DossMcLean, Kelly A, PA-C  tamsulosin (FLOMAX) 0.4 MG CAPS capsule Take 1 capsule (0.4 mg total) by mouth daily for 5 days. 08/29/19 09/03/19 Yes Ronnie DossMcLean, Kelly A, PA-C  triamcinolone cream (KENALOG) 0.1 % Apply 1 application topically as needed.  01/07/19  Yes [provider]    Allergies    Penicillin g  Review of Systems   Review of Systems  Constitutional: Negative for chills and fever.  Respiratory: Negative for cough and shortness of breath.   Cardiovascular: Negative for chest pain.  Gastrointestinal: Negative for abdominal pain,  nausea and vomiting.  Genitourinary: Positive for flank pain. Negative for difficulty urinating, dysuria, hematuria and testicular pain.  Musculoskeletal: Negative for arthralgias and myalgias.  Skin: Negative for color change and rash.  Neurological: Negative for dizziness, syncope and light-headedness.  All other systems reviewed and are negative.   Physical Exam Updated Vital Signs BP (!) 168/82 (BP Location: Left Arm)   Pulse (!) 56   Temp 98.5 F (36.9 C) (Oral)   Resp 18   Ht 5\' 10"  (1.778 m)   Wt 102.5 kg   SpO2 100%   BMI 32.43 kg/m   Physical Exam Vitals and nursing note reviewed.  Constitutional:      General: He is not in acute distress.    Appearance: Normal appearance. He is well-developed. He is obese. He is not ill-appearing or diaphoretic.     Comments: Patient appears uncomfortable but is in no acute distress.  HENT:     Head: Normocephalic and atraumatic.     Mouth/Throat:     Mouth: Mucous membranes are moist.     Pharynx: Oropharynx is clear.  Eyes:     General:        Right eye: No discharge.        Left eye: No discharge.  Cardiovascular:     Rate and Rhythm: Normal rate and regular rhythm.     Pulses: Normal pulses.     Heart sounds: Normal heart sounds. No murmur heard.  No gallop.   Pulmonary:     Effort: Pulmonary effort is normal. No respiratory distress.     Breath sounds: Normal breath sounds. No wheezing or rales.     Comments: Respirations equal and unlabored, patient able to speak in full sentences, lungs clear to auscultation bilaterally Abdominal:     General: Bowel sounds are normal. There is no distension.     Palpations: Abdomen is soft. There is no mass.     Tenderness: There is no abdominal tenderness. There is right CVA tenderness. There is no guarding.     Comments: Abdomen is soft, nondistended, bowel sounds present throughout, no tenderness over the abdomen but patient does have some right-sided CVA tenderness.    Musculoskeletal:        General: No deformity.     Cervical back: Neck supple.  Skin:    General: Skin is warm and dry.     Capillary Refill: Capillary refill takes less than 2 seconds.  Neurological:     Mental Status: He is alert.     Coordination: Coordination normal.     Comments: Speech is clear, able to follow commands Moves extremities without ataxia, coordination intact  Psychiatric:        Mood and Affect: Mood normal.  Behavior: Behavior normal.     ED Results / Procedures / Treatments   Labs (all labs ordered are listed, but only abnormal results are displayed) Labs Reviewed  URINALYSIS, ROUTINE W REFLEX MICROSCOPIC - Abnormal; Notable for the following components:      Result Value   Hgb urine dipstick LARGE (*)    Leukocytes,Ua TRACE (*)    RBC / HPF >50 (*)    Bacteria, UA RARE (*)    All other components within normal limits  BASIC METABOLIC PANEL - Abnormal; Notable for the following components:   Glucose, Bld 101 (*)    Creatinine, Ser 1.95 (*)    GFR calc non Af Amer 36 (*)    GFR calc Af Amer 42 (*)    All other components within normal limits  CBC WITH DIFFERENTIAL/PLATELET - Abnormal; Notable for the following components:   Neutro Abs 7.9 (*)    Monocytes Absolute 1.1 (*)    All other components within normal limits  URINE CULTURE    EKG None  Radiology DG Abdomen 1 View  Result Date: 09/01/2019 CLINICAL DATA:  Right-sided abdominal pain 3 days. Known recent right UPJ stone. EXAM: ABDOMEN - 1 VIEW COMPARISON:  CT 08/29/2019 FINDINGS: Bowel gas pattern demonstrates air throughout the colon. There are several air-filled mildly prominent but nondilated central small bowel loops no free Peri single gallstone over the right upper quadrant no definite renal stones. Patient's previously noted right UPJ stone not well visualized. Several pelvic phleboliths are present. Remainder of the exam is unchanged. IMPRESSION: 1.  Nonspecific, nonobstructive  bowel gas pattern. 2.  Single gallstone. Electronically Signed   By: Elberta Fortis M.D.   On: 09/01/2019 18:05   CT Renal Stone Study  Result Date: 09/01/2019 CLINICAL DATA:  Flank pain EXAM: CT ABDOMEN AND PELVIS WITHOUT CONTRAST TECHNIQUE: Multidetector CT imaging of the abdomen and pelvis was performed following the standard protocol without IV contrast. COMPARISON:  CT abdomen pelvis dated 08/29/2019 FINDINGS: Lower chest: Minimal bibasilar atelectasis. Hepatobiliary: Hepatic steatosis is seen in the anterior aspect of the right hepatic lobe. A gallstone is seen in the gallbladder neck. No gallbladder wall thickening or biliary dilatation. Pancreas: Unremarkable. No pancreatic ductal dilatation or surrounding inflammatory changes. Spleen: Normal in size without focal abnormality. Adrenals/Urinary Tract: Adrenal glands are unremarkable. An obstructing 7 mm calculus in the proximal right ureter results in mild right hydronephrosis, unchanged from 08/29/2019. Surrounding periureteral fat stranding is increased. Punctate nonobstructive renal calculi are redemonstrated, 2 on the left and 1 on the right. There is no left hydronephrosis. Bladder is nondistended. Stomach/Bowel: Stomach is within normal limits. Appendix appears normal. No evidence of bowel wall thickening, distention, or inflammatory changes. Vascular/Lymphatic: Aortic atherosclerosis. No enlarged abdominal or pelvic lymph nodes. Reproductive: Prostate is unremarkable. Other: No abdominal wall hernia or abnormality. No abdominopelvic ascites. Musculoskeletal: No acute or significant osseous findings. IMPRESSION: 1. Obstructing 7 mm calculus in the proximal right ureter results in mild right hydronephrosis, unchanged from 08/29/2019. Surrounding periureteral fat stranding is increased. Aortic Atherosclerosis (ICD10-I70.0). Electronically Signed   By: Romona Curls M.D.   On: 09/01/2019 19:12    Procedures Procedures (including critical care  time)  Medications Ordered in ED Medications  sodium chloride 0.9 % bolus 1,000 mL (1,000 mLs Intravenous New Bag/Given (Non-Interop) 09/01/19 1637)  ondansetron (ZOFRAN) injection 4 mg (4 mg Intravenous Given 09/01/19 1637)  morphine 4 MG/ML injection 4 mg (4 mg Intravenous Given 09/01/19 1637)    ED Course  I have reviewed the triage vital signs and the nursing notes.  Pertinent labs & imaging results that were available during my care of the patient were reviewed by me and considered in my medical decision making (see chart for details).    MDM Rules/Calculators/A&P                         64 year old male presents with worsening right flank pain after he was diagnosed with a right proximal ureteral stone 4 days prior.  Reports until today pain has been well managed at home but since this morning he has had persistent severe pain unimproved with hydrocodone.  No associated vomiting or fevers.  No difficulty urinating.  On exam patient appears uncomfortable but is in no acute distress.  Mildly hypertensive but vitals otherwise normal.  He has no anterior abdominal tenderness but does have some right-sided CVA tenderness.  Will give pain management and check basic labs and UA.  I have independently ordered, reviewed and interpreted all labs and imaging: CBC: No leukocytosis, normal hemoglobin BMP: Creatinine increased to 1.95, was previously 1.2, no other significant electrolyte derangements. UA: Hematuria present, there is now 6-10 WBCs and some rare bacteria present.  Case discussed with Dr. Annabell Howells with urology given elevated kidney function, and he recommends ensuring the patient's pain is well controlled and getting a KUB, if the patient's pain is well controlled and the stone appears to be moving and progressing he recommends discharge home with increased pain management and outpatient follow-up with urology, but if stone is not progressing patient may need surgical intervention.  Unable to  visualize renal stone proceed with CT renal stone study.  Renal stone study shows obstructing 74mm stone still present in the proximal ureter with mild hydropresent and unchanged from 7/2.  CT abdomen stone is now progressing will reconsult urology for potential surgical intervention.  Covid swab sent.  Patient's pain is currently well managed with intermittent doses of morphine.  Case discussed with Dr. Annabell Howells who will see and admit the patient for further intervention proximal ureteral stone.  Final Clinical Impression(s) / ED Diagnoses Final diagnoses:  Ureteral stone with hydronephrosis  Elevated serum creatinine    Rx / DC Orders ED Discharge Orders    None       Legrand Rams 09/01/19 2056    Charlynne Pander, MD 09/02/19 0010

## 2019-09-01 NOTE — Anesthesia Procedure Notes (Signed)
Procedure Name: LMA Insertion Date/Time: 09/01/2019 11:54 PM Performed by: Minerva Ends, CRNA Pre-anesthesia Checklist: Patient identified, Emergency Drugs available, Suction available and Patient being monitored Patient Re-evaluated:Patient Re-evaluated prior to induction Oxygen Delivery Method: Circle System Utilized Preoxygenation: Pre-oxygenation with 100% oxygen Induction Type: IV induction Ventilation: Mask ventilation without difficulty LMA: LMA inserted and LMA with gastric port inserted LMA Size: 4.0 Number of attempts: 1 Placement Confirmation: positive ETCO2 Tube secured with: Tape Dental Injury: Teeth and Oropharynx as per pre-operative assessment  Comments: Smooth IV induction green-- lma insertion AM CRNA atraumatic-- teeth and mouth as preop bilat BS

## 2019-09-01 NOTE — Anesthesia Preprocedure Evaluation (Deleted)
Anesthesia Evaluation  Patient identified by MRN, date of birth, ID band Patient awake    Reviewed: Allergy & Precautions, NPO status , Patient's Chart, lab work & pertinent test results  Airway Mallampati: II  TM Distance: >3 FB     Dental   Pulmonary neg pulmonary ROS,    breath sounds clear to auscultation       Cardiovascular hypertension,  Rhythm:Regular Rate:Normal     Neuro/Psych negative neurological ROS     GI/Hepatic negative GI ROS, Neg liver ROS,   Endo/Other    Renal/GU Renal disease     Musculoskeletal   Abdominal   Peds  Hematology negative hematology ROS (+)   Anesthesia Other Findings   Reproductive/Obstetrics                             Anesthesia Physical Anesthesia Plan  ASA: III  Anesthesia Plan: General   Post-op Pain Management:    Induction: Intravenous  PONV Risk Score and Plan: 2 and Ondansetron, Dexamethasone and Midazolam  Airway Management Planned: Oral ETT  Additional Equipment:   Intra-op Plan:   Post-operative Plan: Extubation in OR  Informed Consent: I have reviewed the patients History and Physical, chart, labs and discussed the procedure including the risks, benefits and alternatives for the proposed anesthesia with the patient or authorized representative who has indicated his/her understanding and acceptance.     Dental advisory given  Plan Discussed with: CRNA and Anesthesiologist  Anesthesia Plan Comments:         Anesthesia Quick Evaluation

## 2019-09-01 NOTE — Consult Note (Signed)
°Subjective: °1. Ureteral stone with hydronephrosis   °2. Elevated serum creatinine   °  °CC: Right flank pain. ° °Hx: Alejandro Mccullough is a 64 yo male who I was asked to see in consultation by Dr. Yao for a 7mm right proximal stone with pain that began on Friday.  This is his second trip to the OR and the stone has not progressed.  He had nausea on Friday but none since.  He has had no hematuria or dysuria.  He has some frequency with hydration.  He has AKI with a Cr of 1.95.  He has a history of a prior stone that he passed in 2017.  He has had no prior GU history or surgery.   His pain is well controlled with pain med currently.  ° °ROS: ° °Review of Systems  °Constitutional: Negative for chills and fever.  °Respiratory: Negative for shortness of breath.   °Cardiovascular: Negative for chest pain.  °Gastrointestinal: Positive for abdominal pain and nausea.  °Genitourinary: Positive for flank pain and frequency. Negative for hematuria.  °All other systems reviewed and are negative. ° ° °Allergies  °Allergen Reactions  °• Penicillin G Rash  ° ° °Past Medical History:  °Diagnosis Date  °• Aneurysm of ascending aorta (HCC)   °• Hyperlipidemia   °• Hypertension   °• Renal disorder   ° ° °Past Surgical History:  °Procedure Laterality Date  °• other    ° None  °• TONSILLECTOMY    ° at the age of 9  ° ° °Social History  ° °Socioeconomic History  °• Marital status: Married  °  Spouse name: Not on file  °• Number of children: 1  °• Years of education: Not on file  °• Highest education level: Not on file  °Occupational History  °• Not on file  °Tobacco Use  °• Smoking status: Never Smoker  °• Smokeless tobacco: Never Used  °Vaping Use  °• Vaping Use: Never used  °Substance and Sexual Activity  °• Alcohol use: Yes  °  Comment: occ  °• Drug use: Never  °• Sexual activity: Not on file  °Other Topics Concern  °• Not on file  °Social History Narrative  °• Not on file  ° °Social Determinants of Health  ° °Financial Resource Strain:   °•  Difficulty of Paying Living Expenses:   °Food Insecurity:   °• Worried About Running Out of Food in the Last Year:   °• Ran Out of Food in the Last Year:   °Transportation Needs:   °• Lack of Transportation (Medical):   °• Lack of Transportation (Non-Medical):   °Physical Activity:   °• Days of Exercise per Week:   °• Minutes of Exercise per Session:   °Stress:   °• Feeling of Stress :   °Social Connections:   °• Frequency of Communication with Friends and Family:   °• Frequency of Social Gatherings with Friends and Family:   °• Attends Religious Services:   °• Active Member of Clubs or Organizations:   °• Attends Club or Organization Meetings:   °• Marital Status:   °Intimate Partner Violence:   °• Fear of Current or Ex-Partner:   °• Emotionally Abused:   °• Physically Abused:   °• Sexually Abused:   ° ° °Family History  °Problem Relation Age of Onset  °• Hypertension Mother   °• Aortic aneurysm Neg Hx   ° ° °Anti-infectives: °Anti-infectives (From admission, onward)  ° None  °  ° ° °No current   facility-administered medications for this encounter.  ° °Current Outpatient Medications  °Medication Sig Dispense Refill  °• amLODipine (NORVASC) 5 MG tablet Take 1 tablet (5 mg total) by mouth daily. 180 tablet 3  °• aspirin EC 81 MG tablet Take 1 tablet (81 mg total) by mouth daily. 90 tablet 3  °• atenolol (TENORMIN) 50 MG tablet Take 1 tablet (50 mg total) by mouth daily. 90 tablet 3  °• atorvastatin (LIPITOR) 40 MG tablet Take 1 tablet (40 mg total) by mouth daily. 90 tablet 3  °• Cholecalciferol (VITAMIN D3) 125 MCG (5000 UT) TABS Take by mouth daily.    °• HYDROcodone-acetaminophen (NORCO/VICODIN) 5-325 MG tablet Take 1 tablet by mouth every 6 (six) hours as needed for moderate pain.    °• losartan (COZAAR) 100 MG tablet Take 1 tablet (100 mg total) by mouth daily. 90 tablet 3  °• Methylcellulose, Laxative, (CITRUCEL) 500 MG TABS Take 2 tablets by mouth daily. Citrucel    °• ondansetron (ZOFRAN) 4 MG tablet Take 1  tablet (4 mg total) by mouth every 6 (six) hours. (Patient taking differently: Take 4 mg by mouth 4 (four) times daily as needed for nausea or vomiting. ) 12 tablet 0  °• tamsulosin (FLOMAX) 0.4 MG CAPS capsule Take 1 capsule (0.4 mg total) by mouth daily for 5 days. 5 capsule 0  °• triamcinolone cream (KENALOG) 0.1 % Apply 1 application topically as needed.     ° ° ° °Objective: °Vital signs in last 24 hours: °BP (!) 147/75    Pulse 65    Temp 98.3 °F (36.8 °C) (Oral)    Resp 15    Ht 5' 10" (1.778 m)    Wt 102.5 kg    SpO2 96%    BMI 32.43 kg/m²  ° °Intake/Output from previous day: °No intake/output data recorded. °Intake/Output this shift: °Total I/O °In: 1000 [IV Piggyback:1000] °Out: -  ° ° °Physical Exam °Vitals reviewed.  °Constitutional:   °   Appearance: Normal appearance. He is obese.  °Cardiovascular:  °   Rate and Rhythm: Normal rate and regular rhythm.  °   Heart sounds: Normal heart sounds.  °Pulmonary:  °   Effort: Pulmonary effort is normal. No respiratory distress.  °   Breath sounds: Normal breath sounds.  °Abdominal:  °   Palpations: Abdomen is soft.  °   Tenderness: There is right CVA tenderness.  °Musculoskeletal:     °   General: No swelling. Normal range of motion.  °   Cervical back: Normal range of motion and neck supple.  °Skin: °   General: Skin is warm and dry.  °Neurological:  °   General: No focal deficit present.  °   Mental Status: He is alert and oriented to person, place, and time.  °Psychiatric:     °   Mood and Affect: Mood normal.  ° ° ° °Lab Results:  °Results for orders placed or performed during the hospital encounter of 09/01/19 (from the past 24 hour(s))  °Urinalysis, Routine w reflex microscopic- may I&O cath if menses     Status: Abnormal  ° Collection Time: 09/01/19  4:18 PM  °Result Value Ref Range  ° Color, Urine YELLOW YELLOW  ° APPearance CLEAR CLEAR  ° Specific Gravity, Urine 1.016 1.005 - 1.030  ° pH 5.0 5.0 - 8.0  ° Glucose, UA NEGATIVE NEGATIVE mg/dL  ° Hgb urine  dipstick LARGE (A) NEGATIVE  ° Bilirubin Urine NEGATIVE NEGATIVE  ° Ketones, ur   NEGATIVE NEGATIVE mg/dL  ° Protein, ur NEGATIVE NEGATIVE mg/dL  ° Nitrite NEGATIVE NEGATIVE  ° Leukocytes,Ua TRACE (A) NEGATIVE  ° RBC / HPF >50 (H) 0 - 5 RBC/hpf  ° WBC, UA 6-10 0 - 5 WBC/hpf  ° Bacteria, UA RARE (A) NONE SEEN  ° Squamous Epithelial / LPF 0-5 0 - 5  ° Mucus PRESENT   °Basic metabolic panel     Status: Abnormal  ° Collection Time: 09/01/19  4:20 PM  °Result Value Ref Range  ° Sodium 138 135 - 145 mmol/L  ° Potassium 4.5 3.5 - 5.1 mmol/L  ° Chloride 103 98 - 111 mmol/L  ° CO2 25 22 - 32 mmol/L  ° Glucose, Bld 101 (H) 70 - 99 mg/dL  ° BUN 16 8 - 23 mg/dL  ° Creatinine, Ser 1.95 (H) 0.61 - 1.24 mg/dL  ° Calcium 9.3 8.9 - 10.3 mg/dL  ° GFR calc non Af Amer 36 (L) >60 mL/min  ° GFR calc Af Amer 42 (L) >60 mL/min  ° Anion gap 10 5 - 15  °CBC with Differential     Status: Abnormal  ° Collection Time: 09/01/19  4:20 PM  °Result Value Ref Range  ° WBC 10.5 4.0 - 10.5 K/uL  ° RBC 4.56 4.22 - 5.81 MIL/uL  ° Hemoglobin 15.4 13.0 - 17.0 g/dL  ° HCT 45.4 39 - 52 %  ° MCV 99.6 80.0 - 100.0 fL  ° MCH 33.8 26.0 - 34.0 pg  ° MCHC 33.9 30.0 - 36.0 g/dL  ° RDW 12.4 11.5 - 15.5 %  ° Platelets 237 150 - 400 K/uL  ° nRBC 0.0 0.0 - 0.2 %  ° Neutrophils Relative % 76 %  ° Neutro Abs 7.9 (H) 1.7 - 7.7 K/uL  ° Lymphocytes Relative 14 %  ° Lymphs Abs 1.5 0.7 - 4.0 K/uL  ° Monocytes Relative 10 %  ° Monocytes Absolute 1.1 (H) 0 - 1 K/uL  ° Eosinophils Relative 0 %  ° Eosinophils Absolute 0.0 0 - 0 K/uL  ° Basophils Relative 0 %  ° Basophils Absolute 0.0 0 - 0 K/uL  ° Immature Granulocytes 0 %  ° Abs Immature Granulocytes 0.03 0.00 - 0.07 K/uL  °  °BMET °Recent Labs  °  09/01/19 °1620  °NA 138  °K 4.5  °CL 103  °CO2 25  °GLUCOSE 101*  °BUN 16  °CREATININE 1.95*  °CALCIUM 9.3  ° °PT/INR °No results for input(s): LABPROT, INR in the last 72 hours. °ABG °No results for input(s): PHART, HCO3 in the last 72 hours. ° °Invalid input(s): PCO2,  PO2 ° °Studies/Results: °DG Abdomen 1 View ° °Result Date: 09/01/2019 °CLINICAL DATA:  Right-sided abdominal pain 3 days. Known recent right UPJ stone. EXAM: ABDOMEN - 1 VIEW COMPARISON:  CT 08/29/2019 FINDINGS: Bowel gas pattern demonstrates air throughout the colon. There are several air-filled mildly prominent but nondilated central small bowel loops no free Peri single gallstone over the right upper quadrant no definite renal stones. Patient's previously noted right UPJ stone not well visualized. Several pelvic phleboliths are present. Remainder of the exam is unchanged. IMPRESSION: 1.  Nonspecific, nonobstructive bowel gas pattern. 2.  Single gallstone. Electronically Signed   By: Daniel  Boyle M.D.   On: 09/01/2019 18:05  ° °CT Renal Stone Study ° °Result Date: 09/01/2019 °CLINICAL DATA:  Flank pain EXAM: CT ABDOMEN AND PELVIS WITHOUT CONTRAST TECHNIQUE: Multidetector CT imaging of the abdomen and pelvis was performed following the standard protocol without IV   contrast. COMPARISON:  CT abdomen pelvis dated 08/29/2019 FINDINGS: Lower chest: Minimal bibasilar atelectasis. Hepatobiliary: Hepatic steatosis is seen in the anterior aspect of the right hepatic lobe. A gallstone is seen in the gallbladder neck. No gallbladder wall thickening or biliary dilatation. Pancreas: Unremarkable. No pancreatic ductal dilatation or surrounding inflammatory changes. Spleen: Normal in size without focal abnormality. Adrenals/Urinary Tract: Adrenal glands are unremarkable. An obstructing 7 mm calculus in the proximal right ureter results in mild right hydronephrosis, unchanged from 08/29/2019. Surrounding periureteral fat stranding is increased. Punctate nonobstructive renal calculi are redemonstrated, 2 on the left and 1 on the right. There is no left hydronephrosis. Bladder is nondistended. Stomach/Bowel: Stomach is within normal limits. Appendix appears normal. No evidence of bowel wall thickening, distention, or inflammatory  changes. Vascular/Lymphatic: Aortic atherosclerosis. No enlarged abdominal or pelvic lymph nodes. Reproductive: Prostate is unremarkable. Other: No abdominal wall hernia or abnormality. No abdominopelvic ascites. Musculoskeletal: No acute or significant osseous findings. IMPRESSION: 1. Obstructing 7 mm calculus in the proximal right ureter results in mild right hydronephrosis, unchanged from 08/29/2019. Surrounding periureteral fat stranding is increased. Aortic Atherosclerosis (ICD10-I70.0). Electronically Signed   By: Tyler  Litton M.D.   On: 09/01/2019 19:12  ° °Case discussed with Dr. Yao.  Imaging and labs reviewed. ° °Assessment/Plan: °7mm right proximal stone with obstruction with pain and AKI.  I discussed ureteroscopy and stenting vs continued MET or ESWL.   He would like to proceed with ureteroscopy.   I have reviewed the risks of bleeding, infection, ureteral injury, need for secondary procedures, thrombotic events and anesthetic complications.   ° °  ° °  ° °No follow-ups on file.  ° ° °CC: Dr. David Yao and Dr. Austin Skakle.  ° ° ° °Anand Tejada °09/01/2019 °[phone] ° °

## 2019-09-02 ENCOUNTER — Other Ambulatory Visit: Payer: Self-pay | Admitting: Urology

## 2019-09-02 ENCOUNTER — Emergency Department (HOSPITAL_COMMUNITY): Payer: BC Managed Care – PPO

## 2019-09-02 DIAGNOSIS — N179 Acute kidney failure, unspecified: Secondary | ICD-10-CM | POA: Diagnosis not present

## 2019-09-02 DIAGNOSIS — R7989 Other specified abnormal findings of blood chemistry: Secondary | ICD-10-CM | POA: Diagnosis not present

## 2019-09-02 DIAGNOSIS — N132 Hydronephrosis with renal and ureteral calculous obstruction: Secondary | ICD-10-CM | POA: Diagnosis not present

## 2019-09-02 MED ORDER — DEXAMETHASONE SODIUM PHOSPHATE 10 MG/ML IJ SOLN
INTRAMUSCULAR | Status: DC | PRN
  Administered 2019-09-01: 10 mg via INTRAVENOUS

## 2019-09-02 MED ORDER — MIDAZOLAM HCL 2 MG/2ML IJ SOLN
INTRAMUSCULAR | Status: DC | PRN
  Administered 2019-09-01 (×2): 1 mg via INTRAVENOUS

## 2019-09-02 MED ORDER — HYDROMORPHONE HCL 1 MG/ML IJ SOLN: 0.2500 mg | INTRAMUSCULAR | Status: DC | PRN

## 2019-09-02 MED ORDER — FENTANYL CITRATE (PF) 100 MCG/2ML IJ SOLN
INTRAMUSCULAR | Status: DC | PRN
  Administered 2019-09-01 (×2): 50 ug via INTRAVENOUS
  Administered 2019-09-02: 25 ug via INTRAVENOUS

## 2019-09-02 MED ORDER — MORPHINE SULFATE (PF) 4 MG/ML IV SOLN: 2.0000 mg | INTRAVENOUS | Status: DC | PRN

## 2019-09-02 MED ORDER — SODIUM CHLORIDE 0.9% FLUSH: 3.0000 mL | INTRAVENOUS | Status: DC | PRN

## 2019-09-02 MED ORDER — SODIUM CHLORIDE 0.9 % IR SOLN
Status: DC | PRN
  Administered 2019-09-01: 6000 mL

## 2019-09-02 MED ORDER — LACTATED RINGERS IV SOLN: INTRAVENOUS | Status: DC | PRN

## 2019-09-02 MED ORDER — SODIUM CHLORIDE 0.9 % IR SOLN
Status: DC | PRN
  Administered 2019-09-01: 1000 mL

## 2019-09-02 MED ORDER — SODIUM CHLORIDE 0.9% FLUSH: 3.0000 mL | Freq: Two times a day (BID) | INTRAVENOUS | Status: DC

## 2019-09-02 MED ORDER — ACETAMINOPHEN 650 MG RE SUPP
650.0000 mg | RECTAL | Status: DC | PRN
  Filled 2019-09-02: qty 1

## 2019-09-02 MED ORDER — LIDOCAINE 2% (20 MG/ML) 5 ML SYRINGE
INTRAMUSCULAR | Status: DC | PRN
  Administered 2019-09-01: 80 mg via INTRAVENOUS

## 2019-09-02 MED ORDER — PHENAZOPYRIDINE HCL 200 MG PO TABS
200.0000 mg | ORAL_TABLET | Freq: Three times a day (TID) | ORAL | 2 refills | Status: DC | PRN
Start: 2019-09-02 — End: 2019-10-24

## 2019-09-02 MED ORDER — PROPOFOL 10 MG/ML IV BOLUS
INTRAVENOUS | Status: DC | PRN
  Administered 2019-09-01: 180 mg via INTRAVENOUS

## 2019-09-02 MED ORDER — OXYCODONE HCL 5 MG PO TABS: 5.0000 mg | ORAL_TABLET | ORAL | Status: DC | PRN

## 2019-09-02 MED ORDER — ACETAMINOPHEN 325 MG PO TABS: 650.0000 mg | ORAL_TABLET | ORAL | Status: DC | PRN

## 2019-09-02 MED ORDER — IOHEXOL 300 MG/ML  SOLN
INTRAMUSCULAR | Status: DC | PRN
  Administered 2019-09-01: 3 mL via URETHRAL

## 2019-09-02 MED ORDER — FENTANYL CITRATE (PF) 100 MCG/2ML IJ SOLN
INTRAMUSCULAR | Status: AC
  Filled 2019-09-02: qty 2

## 2019-09-02 MED ORDER — ONDANSETRON HCL 4 MG/2ML IJ SOLN
INTRAMUSCULAR | Status: DC | PRN
  Administered 2019-09-01: 4 mg via INTRAVENOUS

## 2019-09-02 MED ORDER — SODIUM CHLORIDE 0.9 % IV SOLN: 250.0000 mL | INTRAVENOUS | Status: DC | PRN

## 2019-09-02 NOTE — Anesthesia Procedure Notes (Addendum)
Date/Time: 09/02/2019 12:22 AM Performed by: Minerva Ends, CRNA Oxygen Delivery Method: Simple face mask Placement Confirmation: positive ETCO2 and breath sounds checked- equal and bilateral Dental Injury: Teeth and Oropharynx as per pre-operative assessment

## 2019-09-02 NOTE — Op Note (Signed)
Procedure: 1.  Cystoscopy with right retrograde pyelogram and interpretation. 2.  Right ureteroscopy. 3.  Cystoscopy with insertion of right double-J stent. 4.  Application of fluoroscopy.  Preop diagnosis: Right proximal ureteral stone with obstruction pain and AKI.  Postop diagnosis: Same with very tight ureter.  Surgeon: Dr. Bjorn Pippin.  Anesthesia: General.  Drain: 6 French by 26 cm right contour double-J stent without tether.  Specimen: None.  EBL: None.  Complications: None.  Indications: The patient is a 64 year old white male who has a 7 mm right proximal stone with pain and AKI.  He is made 2 trips to the emergency room and would like to proceed with therapy.  Procedure: He was given 2 g of Rocephin.  Was taken operating room where general anesthetic was induced.  He was placed in lithotomy position and fitted with PAS hose.  His perineum and genitalia were prepped with Betadine solution he was draped in usual sterile fashion.  Cystoscopy was performed using the 23 [place] scope and the 30 degree lens.  In order to pass the scope the urethra was calibrated from 24-26 [place] with Sissy Hoff sounds is a mild meatal stenosis.  The urethra was otherwise unremarkable.  The prostatic urethra short without obstruction.  Examination revealed no trabeculation.  No tumors or stones were seen.  Ureteral orifices were in the normal anatomic position and there was bloody urine effluxing from the right ureteral orifice.  A 5 French opening catheter was then passed through the scope and inserted in the right ureteral meatus.  Contrast was instilled.  The right retrograde pyelogram demonstrated somewhat delicate but otherwise normal distal, mid and lower proximal ureter.  There was a filling defect in the mid proximal ureter consistent with a stone with proximal dilation.  A sensor wire was passed through the open-ended catheter to the kidney by the stone without difficulty.  There was some  bloody flux alongside the wire.  The cystoscope was removed and a 12 [place] inner core of a 35 cm digital access sheath was passed over the wire and attempt to dilate the ureter but I was only able to advance the dilator approximately 2 to 3 cm above the meatus.  At this point a 4.5 French semirigid ureteroscope was passed alongside the wire with a second wire used as a filiform and I was able to get just below the stone but not to it and I was not able to visualize it.  The wire was removed and attempt to see the stone better but that caused the ureter to drape around the tip of the scope further impairing vision.  The second wire was advanced the kidney through the scope and the scope was removed.  An attempt was then made to pass the single-lumen digital flexible scope over the wire but I was unable to advance it beyond approximately 3 cm up the ureter.  At this point the cystoscope was reinserted over the wire and a 6 [place] by 26 cm contour double-J stent was advanced the kidney under fluoroscopic guidance.  The wire was removed, leaving a good coil in the kidney and a good coil in the bladder.  The bladder was then drained and the cystoscope was removed.  Patient was taken down from lithotomy position, his anesthetic was reversed and he was moved recovery in stable condition.  There were no complications.  He will be scheduled for subsequent ureteroscopic stone extraction in approximately 1 to 2 weeks.

## 2019-09-02 NOTE — Transfer of Care (Signed)
Immediate Anesthesia Transfer of Care Note  Patient: Alejandro Mccullough  Procedure(s) Performed: CYSTOSCOPY WITH RETROGRADE PYELOGRAM, URETEROSCOPY AND STENT PLACEMENT (N/A )  Patient Location: PACU  Anesthesia Type:General  Level of Consciousness: awake and alert   Airway & Oxygen Therapy: Patient Spontanous Breathing and Patient connected to face mask oxygen  Post-op Assessment: Report given to RN and Post -op Vital signs reviewed and stable  Post vital signs: Reviewed and stable  Last Vitals:  Vitals Value Taken Time  BP 129/74 09/02/19 0030  Temp    Pulse 64 09/02/19 0042  Resp 15 09/02/19 0042  SpO2 99 % 09/02/19 0042  Vitals shown include unvalidated device data.  Last Pain:  Vitals:   09/02/19 0030  TempSrc:   PainSc: 0-No pain      Patients Stated Pain Goal: 4 (09/02/19 0030)  Complications: No complications documented.

## 2019-09-02 NOTE — Progress Notes (Signed)
Time entry incorrect- D/C teaching was done w/pt's wife at 0130.

## 2019-09-02 NOTE — Discharge Instructions (Signed)
Ureteral Stent Implantation, Care After This sheet gives you information about how to care for yourself after your procedure. Your health care provider may also give you more specific instructions. If you have problems or questions, contact your health care provider. What can I expect after the procedure? After the procedure, it is common to have:  Nausea.  Mild pain when you urinate. You may feel this pain in your lower back or lower abdomen. The pain should stop within a few minutes after you urinate. This may last for up to 1 week.  A small amount of blood in your urine for several days. Follow these instructions at home: Medicines  Take over-the-counter and prescription medicines only as told by your health care provider.  If you were prescribed an antibiotic medicine, take it as told by your health care provider. Do not stop taking the antibiotic even if you start to feel better.  Do not drive for 24 hours if you were given a sedative during your procedure.  Ask your health care provider if the medicine prescribed to you requires you to avoid driving or using heavy machinery. Activity  Rest as told by your health care provider.  Avoid sitting for a long time without moving. Get up to take short walks every 1-2 hours. This is important to improve blood flow and breathing. Ask for help if you feel weak or unsteady.  Return to your normal activities as told by your health care provider. Ask your health care provider what activities are safe for you. General instructions   Watch for any blood in your urine. Call your health care provider if the amount of blood in your urine increases.  If you have a catheter: ? Follow instructions from your health care provider about taking care of your catheter and collection bag. ? Do not take baths, swim, or use a hot tub until your health care provider approves. Ask your health care provider if you may take showers. You may only be allowed to  take sponge baths.  Drink enough fluid to keep your urine pale yellow.  Do not use any products that contain nicotine or tobacco, such as cigarettes, e-cigarettes, and chewing tobacco. These can delay healing after surgery. If you need help quitting, ask your health care provider.  Keep all follow-up visits as told by your health care provider. This is important. Contact a health care provider if:  You have pain that gets worse or does not get better with medicine, especially pain when you urinate.  You have difficulty urinating.  You feel nauseous or you vomit repeatedly during a period of more than 2 days after the procedure. Get help right away if:  Your urine is dark red or has blood clots in it.  You are leaking urine (have incontinence).  The end of the stent comes out of your urethra.  You cannot urinate.  You have sudden, sharp, or severe pain in your abdomen or lower back.  You have a fever.  You have swelling or pain in your legs.  You have difficulty breathing. Summary  After the procedure, it is common to have mild pain when you urinate that goes away within a few minutes after you urinate. This may last for up to 1 week.  Watch for any blood in your urine. Call your health care provider if the amount of blood in your urine increases.  Take over-the-counter and prescription medicines only as told by your health care provider.  Drink   enough fluid to keep your urine pale yellow. This information is not intended to replace advice given to you by your health care provider. Make sure you discuss any questions you have with your health care provider. Document Revised: 11/20/2017 Document Reviewed: 11/21/2017 Elsevier Patient Education  2020 [employer]  General Anesthesia, Adult, Care After This sheet gives you information about how to care for yourself after your procedure. Your health care provider may also give you more specific instructions. If you have  problems or questions, contact your health care provider. What can I expect after the procedure? After the procedure, the following side effects are common:  Pain or discomfort at the IV site.  Nausea.  Vomiting.  Sore throat.  Trouble concentrating.  Feeling cold or chills.  Weak or tired.  Sleepiness and fatigue.  Soreness and body aches. These side effects can affect parts of the body that were not involved in surgery. Follow these instructions at home:  For at least 24 hours after the procedure:  Have a responsible adult stay with you. It is important to have someone help care for you until you are awake and alert.  Rest as needed.  Do not: ? Participate in activities in which you could fall or become injured. ? Drive. ? Use heavy machinery. ? Drink alcohol. ? Take sleeping pills or medicines that cause drowsiness. ? Make important decisions or sign legal documents. ? Take care of children on your own. Eating and drinking  Follow any instructions from your health care provider about eating or drinking restrictions.  When you feel hungry, start by eating small amounts of foods that are soft and easy to digest (bland), such as toast. Gradually return to your regular diet.  Drink enough fluid to keep your urine pale yellow.  If you vomit, rehydrate by drinking water, juice, or clear broth. General instructions  If you have sleep apnea, surgery and certain medicines can increase your risk for breathing problems. Follow instructions from your health care provider about wearing your sleep device: ? Anytime you are sleeping, including during daytime naps. ? While taking prescription pain medicines, sleeping medicines, or medicines that make you drowsy.  Return to your normal activities as told by your health care provider. Ask your health care provider what activities are safe for you.  Take over-the-counter and prescription medicines only as told by your health  care provider.  If you smoke, do not smoke without supervision.  Keep all follow-up visits as told by your health care provider. This is important. Contact a health care provider if:  You have nausea or vomiting that does not get better with medicine.  You cannot eat or drink without vomiting.  You have pain that does not get better with medicine.  You are unable to pass urine.  You develop a skin rash.  You have a fever.  You have redness around your IV site that gets worse. Get help right away if:  You have difficulty breathing.  You have chest pain.  You have blood in your urine or stool, or you vomit blood. Summary  After the procedure, it is common to have a sore throat or nausea. It is also common to feel tired.  Have a responsible adult stay with you for the first 24 hours after general anesthesia. It is important to have someone help care for you until you are awake and alert.  When you feel hungry, start by eating small amounts of foods that are soft   and easy to digest (bland), such as toast. Gradually return to your regular diet.  Drink enough fluid to keep your urine pale yellow.  Return to your normal activities as told by your health care provider. Ask your health care provider what activities are safe for you. This information is not intended to replace advice given to you by your health care provider. Make sure you discuss any questions you have with your health care provider. Document Revised: 02/16/2017 Document Reviewed: 09/29/2016 Elsevier Patient Education  2020 [employer]  

## 2019-09-03 ENCOUNTER — Encounter (HOSPITAL_COMMUNITY): Payer: Self-pay | Admitting: Anesthesiology

## 2019-09-03 LAB — URINE CULTURE: Culture: NO GROWTH

## 2019-09-03 NOTE — Anesthesia Preprocedure Evaluation (Deleted)

## 2019-09-04 ENCOUNTER — Encounter (HOSPITAL_COMMUNITY): Payer: Self-pay | Admitting: *Deleted

## 2019-09-05 ENCOUNTER — Encounter (HOSPITAL_COMMUNITY): Payer: Self-pay | Admitting: Urology

## 2019-09-05 NOTE — Anesthesia Preprocedure Evaluation (Signed)
Anesthesia Evaluation  Patient identified by MRN, date of birth, ID band Patient awake    Reviewed: Allergy & Precautions, NPO status , Patient's Chart, lab work & pertinent test results  Airway Mallampati: II  TM Distance: >3 FB     Dental   Pulmonary neg pulmonary ROS,    breath sounds clear to auscultation       Cardiovascular hypertension,  Rhythm:Regular Rate:Normal     Neuro/Psych    GI/Hepatic negative GI ROS, Neg liver ROS,   Endo/Other    Renal/GU Renal disease     Musculoskeletal   Abdominal   Peds  Hematology   Anesthesia Other Findings   Reproductive/Obstetrics                             Anesthesia Physical Anesthesia Plan  ASA: III  Anesthesia Plan: General   Post-op Pain Management:    Induction: Intravenous  PONV Risk Score and Plan: 2 and Ondansetron  Airway Management Planned: Oral ETT  Additional Equipment:   Intra-op Plan:   Post-operative Plan: Extubation in OR  Informed Consent: I have reviewed the patients History and Physical, chart, labs and discussed the procedure including the risks, benefits and alternatives for the proposed anesthesia with the patient or authorized representative who has indicated his/her understanding and acceptance.     Dental advisory given  Plan Discussed with: CRNA, Anesthesiologist and Surgeon  Anesthesia Plan Comments:         Anesthesia Quick Evaluation

## 2019-09-05 NOTE — Anesthesia Postprocedure Evaluation (Signed)
Anesthesia Post Note  Patient: Alejandro Mccullough  Procedure(s) Performed: CYSTOSCOPY WITH RETROGRADE PYELOGRAM WITH INTERPRETATION, RIGHT URETEROSCOPY AND RIGHT STENT PLACEMENT (Right Ureter)     Anesthesia Post Evaluation No complications documented.  Last Vitals:  Vitals:   09/02/19 0100 09/02/19 0145  BP: 137/82 135/67  Pulse: 68 64  Resp: 13 17  Temp: 36.7 C 36.6 C  SpO2: 96% 98%    Last Pain:  Vitals:   09/02/19 0145  TempSrc:   PainSc: 0-No pain                 Darcia Lampi

## 2019-09-08 DIAGNOSIS — N201 Calculus of ureter: Secondary | ICD-10-CM | POA: Diagnosis not present

## 2019-09-08 DIAGNOSIS — R8271 Bacteriuria: Secondary | ICD-10-CM | POA: Diagnosis not present

## 2019-09-12 ENCOUNTER — Other Ambulatory Visit: Payer: Self-pay

## 2019-09-12 ENCOUNTER — Encounter (HOSPITAL_BASED_OUTPATIENT_CLINIC_OR_DEPARTMENT_OTHER): Payer: Self-pay | Admitting: Urology

## 2019-09-12 NOTE — Progress Notes (Addendum)
Spoke w/ via phone for pre-op interview---pt Lab needs dos----    I stat 8             COVID test ------09-20-2019 1230 pm  Arrive at -------645 am 09-23-2019 NPO after MN NO Solid Food.  Clear liquids from MN until---545 am then npo Medications to take morning of surgery -----atorvastatin, atenolol, amlodipine Diabetic medication -----n/a Patient Special Instructions -----bring cpap mask tubing and machine and leave in car Pre-Op special Istructions -----none Patient verbalized understanding of instructions that were given at this phone interview. Patient denies shortness of breath, chest pain, fever, cough a this phone interview.  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 pt denies cardiac S & S, pt denies sob at pre op call.     PCP: dr Charlane Ferretti Ginette Otto ) lov fall 2020 Cardiologist :none lov cardio thoracic michelle amber pa 10-09-2018 care everywhere on chart Chest x-ray :chest ct 10-09-2018 care everywhere on chart EKG : 01-08-2019 epic Echo :none Cardiac Cath : none Activity level: can climb flight of steps without difficulty, does housework Sleep Study/ CPAP : cpap set on 10 Fasting Blood Sugar :      / Checks Blood Sugar -- times a day:  n/a Blood Thinner/ Instructions /Last Dose:n/a ASA / Instructions/ Last Dose : pt instructed to stop 81 mg aspirin x 2 days before surgery per dr Annabell Howells

## 2019-09-20 ENCOUNTER — Other Ambulatory Visit (HOSPITAL_COMMUNITY)
Admission: RE | Admit: 2019-09-20 | Discharge: 2019-09-20 | Disposition: A | Discharge status: Home / Self Care | Payer: BC Managed Care – PPO | Source: Ambulatory Visit | Attending: Urology | Admitting: Urology

## 2019-09-20 DIAGNOSIS — Z01812 Encounter for preprocedural laboratory examination: Secondary | ICD-10-CM | POA: Diagnosis not present

## 2019-09-20 DIAGNOSIS — Z20822 Contact with and (suspected) exposure to covid-19: Secondary | ICD-10-CM | POA: Insufficient documentation

## 2019-09-20 LAB — SARS CORONAVIRUS 2 (TAT 6-24 HRS): SARS Coronavirus 2: NEGATIVE

## 2019-09-22 NOTE — Anesthesia Preprocedure Evaluation (Addendum)
Anesthesia Evaluation  Patient identified by MRN, date of birth, ID band Patient awake    Reviewed: Allergy & Precautions, NPO status , Patient's Chart, lab work & pertinent test results  History of Anesthesia Complications Negative for: history of anesthetic complications  Airway Mallampati: II  TM Distance: >3 FB     Dental no notable dental hx. (+) Dental Advisory Given   Pulmonary sleep apnea and Continuous Positive Airway Pressure Ventilation ,    Pulmonary exam normal        Cardiovascular hypertension, Pt. on medications Normal cardiovascular exam  Ascending aneurysm: monitoring   Neuro/Psych negative neurological ROS     GI/Hepatic negative GI ROS, Neg liver ROS,   Endo/Other  negative endocrine ROS  Renal/GU      Musculoskeletal negative musculoskeletal ROS (+)   Abdominal   Peds  Hematology negative hematology ROS (+)   Anesthesia Other Findings   Reproductive/Obstetrics                            Anesthesia Physical Anesthesia Plan  ASA: II  Anesthesia Plan: General   Post-op Pain Management:    Induction: Intravenous  PONV Risk Score and Plan: 2 and Ondansetron and Dexamethasone  Airway Management Planned: LMA  Additional Equipment:   Intra-op Plan:   Post-operative Plan: Extubation in OR  Informed Consent: I have reviewed the patients History and Physical, chart, labs and discussed the procedure including the risks, benefits and alternatives for the proposed anesthesia with the patient or authorized representative who has indicated his/her understanding and acceptance.     Dental advisory given  Plan Discussed with: Anesthesiologist and CRNA  Anesthesia Plan Comments:        Anesthesia Quick Evaluation

## 2019-09-23 ENCOUNTER — Ambulatory Visit (HOSPITAL_BASED_OUTPATIENT_CLINIC_OR_DEPARTMENT_OTHER): Payer: BC Managed Care – PPO | Admitting: Anesthesiology

## 2019-09-23 ENCOUNTER — Encounter (HOSPITAL_BASED_OUTPATIENT_CLINIC_OR_DEPARTMENT_OTHER)
Admission: RE | Disposition: A | Discharge status: Home / Self Care | Payer: Self-pay | Source: Home / Self Care | Attending: Urology

## 2019-09-23 ENCOUNTER — Encounter (HOSPITAL_BASED_OUTPATIENT_CLINIC_OR_DEPARTMENT_OTHER): Payer: Self-pay | Admitting: Urology

## 2019-09-23 ENCOUNTER — Other Ambulatory Visit: Payer: Self-pay

## 2019-09-23 ENCOUNTER — Ambulatory Visit (HOSPITAL_BASED_OUTPATIENT_CLINIC_OR_DEPARTMENT_OTHER)
Admission: RE | Admit: 2019-09-23 | Discharge: 2019-09-23 | Disposition: A | Discharge status: Home / Self Care | Payer: BC Managed Care – PPO | Attending: Urology | Admitting: Urology

## 2019-09-23 DIAGNOSIS — Z8249 Family history of ischemic heart disease and other diseases of the circulatory system: Secondary | ICD-10-CM | POA: Insufficient documentation

## 2019-09-23 DIAGNOSIS — E785 Hyperlipidemia, unspecified: Secondary | ICD-10-CM | POA: Diagnosis not present

## 2019-09-23 DIAGNOSIS — N132 Hydronephrosis with renal and ureteral calculous obstruction: Secondary | ICD-10-CM | POA: Insufficient documentation

## 2019-09-23 DIAGNOSIS — G4730 Sleep apnea, unspecified: Secondary | ICD-10-CM | POA: Diagnosis not present

## 2019-09-23 DIAGNOSIS — Z79899 Other long term (current) drug therapy: Secondary | ICD-10-CM | POA: Diagnosis not present

## 2019-09-23 DIAGNOSIS — N131 Hydronephrosis with ureteral stricture, not elsewhere classified: Secondary | ICD-10-CM | POA: Insufficient documentation

## 2019-09-23 DIAGNOSIS — I10 Essential (primary) hypertension: Secondary | ICD-10-CM | POA: Insufficient documentation

## 2019-09-23 DIAGNOSIS — Z87442 Personal history of urinary calculi: Secondary | ICD-10-CM | POA: Diagnosis not present

## 2019-09-23 DIAGNOSIS — N201 Calculus of ureter: Secondary | ICD-10-CM | POA: Diagnosis not present

## 2019-09-23 DIAGNOSIS — Z880 Allergy status to penicillin: Secondary | ICD-10-CM | POA: Insufficient documentation

## 2019-09-23 DIAGNOSIS — Z7982 Long term (current) use of aspirin: Secondary | ICD-10-CM | POA: Insufficient documentation

## 2019-09-23 HISTORY — PX: CYSTOSCOPY/URETEROSCOPY/HOLMIUM LASER/STENT PLACEMENT: SHX6546

## 2019-09-23 HISTORY — DX: Personal history of urinary calculi: Z87.442

## 2019-09-23 HISTORY — DX: Sleep apnea, unspecified: G47.30

## 2019-09-23 LAB — POCT I-STAT, CHEM 8
BUN: 13 mg/dL (ref 8–23)
Calcium, Ion: 1.33 mmol/L (ref 1.15–1.40)
Chloride: 105 mmol/L (ref 98–111)
Creatinine, Ser: 0.9 mg/dL (ref 0.61–1.24)
Glucose, Bld: 100 mg/dL — ABNORMAL HIGH (ref 70–99)
HCT: 45 % (ref 39.0–52.0)
Hemoglobin: 15.3 g/dL (ref 13.0–17.0)
Potassium: 4.4 mmol/L (ref 3.5–5.1)
Sodium: 142 mmol/L (ref 135–145)
TCO2: 24 mmol/L (ref 22–32)

## 2019-09-23 SURGERY — CYSTOSCOPY/URETEROSCOPY/HOLMIUM LASER/STENT PLACEMENT
Anesthesia: General | Laterality: Right

## 2019-09-23 MED ORDER — FENTANYL CITRATE (PF) 100 MCG/2ML IJ SOLN: 25.0000 ug | INTRAMUSCULAR | Status: DC | PRN

## 2019-09-23 MED ORDER — CELECOXIB 200 MG PO CAPS
ORAL_CAPSULE | ORAL | Status: AC
  Filled 2019-09-23: qty 1

## 2019-09-23 MED ORDER — MIDAZOLAM HCL 2 MG/2ML IJ SOLN
INTRAMUSCULAR | Status: AC
  Filled 2019-09-23: qty 2

## 2019-09-23 MED ORDER — PHENYLEPHRINE HCL (PRESSORS) 10 MG/ML IV SOLN
INTRAVENOUS | Status: DC | PRN
Start: 2019-09-23 — End: 2019-09-23
  Administered 2019-09-23 (×2): 40 ug via INTRAVENOUS

## 2019-09-23 MED ORDER — ONDANSETRON HCL 4 MG/2ML IJ SOLN
INTRAMUSCULAR | Status: DC | PRN
  Administered 2019-09-23: 4 mg via INTRAVENOUS

## 2019-09-23 MED ORDER — PHENYLEPHRINE 40 MCG/ML (10ML) SYRINGE FOR IV PUSH (FOR BLOOD PRESSURE SUPPORT)
PREFILLED_SYRINGE | INTRAVENOUS | Status: AC
  Filled 2019-09-23: qty 10

## 2019-09-23 MED ORDER — ACETAMINOPHEN 500 MG PO TABS
ORAL_TABLET | ORAL | Status: AC
  Filled 2019-09-23: qty 2

## 2019-09-23 MED ORDER — PROMETHAZINE HCL 25 MG/ML IJ SOLN: 6.2500 mg | INTRAMUSCULAR | Status: DC | PRN

## 2019-09-23 MED ORDER — ONDANSETRON HCL 4 MG/2ML IJ SOLN
INTRAMUSCULAR | Status: AC
  Filled 2019-09-23: qty 2

## 2019-09-23 MED ORDER — HYDROCODONE-ACETAMINOPHEN 5-325 MG PO TABS: 1.0000 | ORAL_TABLET | Freq: Four times a day (QID) | ORAL | 0 refills | Status: DC | PRN

## 2019-09-23 MED ORDER — PROPOFOL 10 MG/ML IV BOLUS
INTRAVENOUS | Status: AC
  Filled 2019-09-23: qty 40

## 2019-09-23 MED ORDER — ACETAMINOPHEN 500 MG PO TABS
1000.0000 mg | ORAL_TABLET | Freq: Once | ORAL | Status: AC
  Administered 2019-09-23: 1000 mg via ORAL

## 2019-09-23 MED ORDER — FENTANYL CITRATE (PF) 100 MCG/2ML IJ SOLN
INTRAMUSCULAR | Status: DC | PRN
  Administered 2019-09-23 (×4): 50 ug via INTRAVENOUS

## 2019-09-23 MED ORDER — DEXAMETHASONE SODIUM PHOSPHATE 10 MG/ML IJ SOLN
INTRAMUSCULAR | Status: AC
  Filled 2019-09-23: qty 1

## 2019-09-23 MED ORDER — SODIUM CHLORIDE 0.9 % IR SOLN
Status: DC | PRN
  Administered 2019-09-23: 3000 mL via INTRAVESICAL

## 2019-09-23 MED ORDER — LIDOCAINE HCL (CARDIAC) PF 100 MG/5ML IV SOSY
PREFILLED_SYRINGE | INTRAVENOUS | Status: DC | PRN
  Administered 2019-09-23: 100 mg via INTRAVENOUS

## 2019-09-23 MED ORDER — CIPROFLOXACIN IN D5W 400 MG/200ML IV SOLN
400.0000 mg | INTRAVENOUS | Status: AC
  Administered 2019-09-23: 400 mg via INTRAVENOUS

## 2019-09-23 MED ORDER — CELECOXIB 200 MG PO CAPS
200.0000 mg | ORAL_CAPSULE | Freq: Once | ORAL | Status: AC
  Administered 2019-09-23: 200 mg via ORAL

## 2019-09-23 MED ORDER — LACTATED RINGERS IV SOLN: INTRAVENOUS | Status: DC

## 2019-09-23 MED ORDER — LIDOCAINE 2% (20 MG/ML) 5 ML SYRINGE
INTRAMUSCULAR | Status: AC
  Filled 2019-09-23: qty 5

## 2019-09-23 MED ORDER — PROPOFOL 10 MG/ML IV BOLUS
INTRAVENOUS | Status: DC | PRN
  Administered 2019-09-23: 50 mg via INTRAVENOUS
  Administered 2019-09-23: 200 mg via INTRAVENOUS

## 2019-09-23 MED ORDER — FENTANYL CITRATE (PF) 100 MCG/2ML IJ SOLN
INTRAMUSCULAR | Status: AC
  Filled 2019-09-23: qty 2

## 2019-09-23 MED ORDER — DEXAMETHASONE SODIUM PHOSPHATE 4 MG/ML IJ SOLN
INTRAMUSCULAR | Status: DC | PRN
  Administered 2019-09-23: 10 mg via INTRAVENOUS

## 2019-09-23 MED ORDER — MIDAZOLAM HCL 5 MG/5ML IJ SOLN
INTRAMUSCULAR | Status: DC | PRN
  Administered 2019-09-23: 2 mg via INTRAVENOUS

## 2019-09-23 MED ORDER — SODIUM CHLORIDE 0.9% FLUSH: 3.0000 mL | Freq: Two times a day (BID) | INTRAVENOUS | Status: DC

## 2019-09-23 MED ORDER — CIPROFLOXACIN IN D5W 400 MG/200ML IV SOLN
INTRAVENOUS | Status: AC
  Filled 2019-09-23: qty 200

## 2019-09-23 SURGICAL SUPPLY — 28 items
BAG DRAIN URO-CYSTO SKYTR STRL (DRAIN) ×3 IMPLANT
BAG DRN UROCATH (DRAIN) ×1
BASKET STONE 1.7 NGAGE (UROLOGICAL SUPPLIES) ×3 IMPLANT
BASKET ZERO TIP NITINOL 2.4FR (BASKET) IMPLANT
BSKT STON RTRVL ZERO TP 2.4FR (BASKET)
CATH URET 5FR 28IN CONE TIP (BALLOONS)
CATH URET 5FR 28IN OPEN ENDED (CATHETERS) ×3 IMPLANT
CATH URET 5FR 70CM CONE TIP (BALLOONS) IMPLANT
CLOTH BEACON ORANGE TIMEOUT ST (SAFETY) ×3 IMPLANT
DRSG TEGADERM 2-3/8X2-3/4 SM (GAUZE/BANDAGES/DRESSINGS) ×3 IMPLANT
ELECT REM PT RETURN 9FT ADLT (ELECTROSURGICAL)
ELECTRODE REM PT RTRN 9FT ADLT (ELECTROSURGICAL) IMPLANT
FIBER LASER FLEXIVA 365 (UROLOGICAL SUPPLIES) IMPLANT
FIBER LASER TRAC TIP (UROLOGICAL SUPPLIES) ×3 IMPLANT
GLOVE SURG SS PI 8.0 STRL IVOR (GLOVE) ×3 IMPLANT
GOWN STRL REUS W/TWL XL LVL3 (GOWN DISPOSABLE) ×3 IMPLANT
GUIDEWIRE ANG ZIPWIRE 038X150 (WIRE) ×3 IMPLANT
GUIDEWIRE STR DUAL SENSOR (WIRE) ×3 IMPLANT
IV NS IRRIG 3000ML ARTHROMATIC (IV SOLUTION) ×3 IMPLANT
KIT TURNOVER CYSTO (KITS) ×3 IMPLANT
MANIFOLD NEPTUNE II (INSTRUMENTS) ×3 IMPLANT
NS IRRIG 500ML POUR BTL (IV SOLUTION) ×3 IMPLANT
PACK CYSTO (CUSTOM PROCEDURE TRAY) ×3 IMPLANT
SHEATH URET ACCESS 12FR/35CM (UROLOGICAL SUPPLIES) ×3 IMPLANT
STENT URET 6FRX26 CONTOUR (STENTS) ×3 IMPLANT
TUBE CONNECTING 12'X1/4 (SUCTIONS) ×1
TUBE CONNECTING 12X1/4 (SUCTIONS) ×2 IMPLANT
TUBING UROLOGY SET (TUBING) IMPLANT

## 2019-09-23 NOTE — Anesthesia Procedure Notes (Signed)
Procedure Name: LMA Insertion Date/Time: 09/23/2019 8:19 AM Performed by: Jessica Priest, CRNA Pre-anesthesia Checklist: Patient identified, Emergency Drugs available, Suction available, Patient being monitored and Timeout performed Patient Re-evaluated:Patient Re-evaluated prior to induction Oxygen Delivery Method: Circle system utilized Preoxygenation: Pre-oxygenation with 100% oxygen Induction Type: IV induction Ventilation: Mask ventilation without difficulty LMA: LMA inserted LMA Size: 5.0 Number of attempts: 1 Airway Equipment and Method: Bite block Placement Confirmation: positive ETCO2,  breath sounds checked- equal and bilateral and CO2 detector Tube secured with: Tape Dental Injury: Teeth and Oropharynx as per pre-operative assessment

## 2019-09-23 NOTE — Anesthesia Postprocedure Evaluation (Signed)
Anesthesia Post Note  Patient: Alejandro Mccullough  Procedure(s) Performed: CYSTOSCOPY/URETEROSCOPY/HOLMIUM LASER/ STENT EXCHANGE STONE BASKET EXTRACTION (Right )     Patient location during evaluation: PACU Anesthesia Type: General Level of consciousness: sedated Pain management: pain level controlled Vital Signs Assessment: post-procedure vital signs reviewed and stable Respiratory status: spontaneous breathing and respiratory function stable Cardiovascular status: stable Postop Assessment: no apparent nausea or vomiting Anesthetic complications: no   No complications documented.  Last Vitals:  Vitals:   09/23/19 0945 09/23/19 1000  BP: (!) 134/77 (!) 134/79  Pulse: 55 57  Resp: 14 18  Temp:    SpO2: 96% 97%    Last Pain:  Vitals:   09/23/19 0945  TempSrc:   PainSc: 4                  Eunie Lawn DANIEL

## 2019-09-23 NOTE — Interval H&P Note (Signed)
History and Physical Interval Note:  The ureter was too tight to get to the stone initially so he returns for ureteroscopy and stent exchange today.  He has had some hematuria but minimal pain.   09/23/2019 8:03 AM  Dyke Brackett  has presented today for surgery, with the diagnosis of RIGHT PROXIMAL STONE.  The various methods of treatment have been discussed with the patient and family. After consideration of risks, benefits and other options for treatment, the patient has consented to  Procedure(s): CYSTOSCOPY/URETEROSCOPY/HOLMIUM LASER/ STENT EXCHANGE (Right) as a surgical intervention.  The patient's history has been reviewed, patient examined, no change in status, stable for surgery.  I have reviewed the patient's chart and labs.  Questions were answered to the patient's satisfaction.     Bjorn Pippin

## 2019-09-23 NOTE — Discharge Instructions (Addendum)
Ureteral Stent Implantation, Care After This sheet gives you information about how to care for yourself after your procedure. Your health care provider may also give you more specific instructions. If you have problems or questions, contact your health care provider. What can I expect after the procedure? After the procedure, it is common to have:  Nausea.  Mild pain when you urinate. You may feel this pain in your lower back or lower abdomen. The pain should stop within a few minutes after you urinate. This may last for up to 1 week.  A small amount of blood in your urine for several days. Follow these instructions at home: Medicines  Take over-the-counter and prescription medicines only as told by your health care provider.  If you were prescribed an antibiotic medicine, take it as told by your health care provider. Do not stop taking the antibiotic even if you start to feel better.  Do not drive for 24 hours if you were given a sedative during your procedure.  Ask your health care provider if the medicine prescribed to you requires you to avoid driving or using heavy machinery. Activity  Rest as told by your health care provider.  Avoid sitting for a long time without moving. Get up to take short walks every 1-2 hours. This is important to improve blood flow and breathing. Ask for help if you feel weak or unsteady.  Return to your normal activities as told by your health care provider. Ask your health care provider what activities are safe for you. General instructions   Watch for any blood in your urine. Call your health care provider if the amount of blood in your urine increases.  If you have a catheter: ? Follow instructions from your health care provider about taking care of your catheter and collection bag. ? Do not take baths, swim, or use a hot tub until your health care provider approves. Ask your health care provider if you may take showers. You may only be allowed to  take sponge baths.  Drink enough fluid to keep your urine pale yellow.  Do not use any products that contain nicotine or tobacco, such as cigarettes, e-cigarettes, and chewing tobacco. These can delay healing after surgery. If you need help quitting, ask your health care provider.  Keep all follow-up visits as told by your health care provider. This is important. Contact a health care provider if:  You have pain that gets worse or does not get better with medicine, especially pain when you urinate.  You have difficulty urinating.  You feel nauseous or you vomit repeatedly during a period of more than 2 days after the procedure. Get help right away if:  Your urine is dark red or has blood clots in it.  You are leaking urine (have incontinence).  The end of the stent comes out of your urethra.  You cannot urinate.  You have sudden, sharp, or severe pain in your abdomen or lower back.  You have a fever.  You have swelling or pain in your legs.  You have difficulty breathing. Summary  After the procedure, it is common to have mild pain when you urinate that goes away within a few minutes after you urinate. This may last for up to 1 week.  Watch for any blood in your urine. Call your health care provider if the amount of blood in your urine increases.  Take over-the-counter and prescription medicines only as told by your health care provider.  Drink   enough fluid to keep your urine pale yellow.  You may remove the stent by pulling the attached string on Friday morning.  If you don't feel you can do that, please call the office to come have it removed.   Please bring your stone fragments to the office for analysis.  This information is not intended to replace advice given to you by your health care provider. Make sure you discuss any questions you have with your health care provider. Document Revised: 11/20/2017 Document Reviewed: 11/21/2017 Elsevier Patient Education  2020  [employer]  CYSTOSCOPY HOME CARE INSTRUCTIONS  Activity: Rest for the remainder of the day.  Do not drive or operate equipment today.  You may resume normal activities in one to two days as instructed by your physician.   Meals: Drink plenty of liquids and eat light foods such as gelatin or soup this evening.  You may return to a normal meal plan tomorrow.  Return to Work: You may return to work in one to two days or as instructed by your physician.  Special Instructions / Symptoms: Call your physician if any of these symptoms occur:   -persistent or heavy bleeding  -bleeding which continues after first few urination  -large blood clots that are difficult to pass  -urine stream diminishes or stops completely  -fever equal to or higher than 101 degrees Farenheit.  -cloudy urine with a strong, foul odor  -severe pain  Females should always wipe from front to back after elimination.  You may feel some burning pain when you urinate.  This should disappear with time.  Applying moist heat to the lower abdomen or a hot tub bath may help relieve the pain. \  Follow-Up / Date of Return Visit to Your Physician:  As instructed Post Anesthesia Home Care Instructions  Activity: Get plenty of rest for the remainder of the day. A responsible individual must stay with you for 24 hours following the procedure.  For the next 24 hours, DO NOT: -Drive a car -Advertising copywriter -Drink alcoholic beverages -Take any medication unless instructed by your physician -Make any legal decisions or sign important papers.  Meals: Start with liquid foods such as gelatin or soup. Progress to regular foods as tolerated. Avoid greasy, spicy, heavy foods. If nausea and/or vomiting occur, drink only clear liquids until the nausea and/or vomiting subsides. Call your physician if vomiting continues.  Special Instructions/Symptoms: Your throat may feel dry or sore from the anesthesia or the breathing tube placed  in your throat during surgery. If this causes discomfort, gargle with warm salt water. The discomfort should disappear within 24 hours.  If you had a scopolamine patch placed behind your ear for the management of post- operative nausea and/or vomiting:  1. The medication in the patch is effective for 72 hours, after which it should be removed.  Wrap patch in a tissue and discard in the trash. Wash hands thoroughly with soap and water. 2. You may remove the patch earlier than 72 hours if you experience unpleasant side effects which may include dry mouth, dizziness or visual disturbances. 3. Avoid touching the patch. Wash your hands with soap and water after contact with the patch.     Call for an appointment to arrange follow-up.  Patient Signature:  ________________________________________________________  Nurse's Signature:  ________________________________________________________

## 2019-09-23 NOTE — Op Note (Signed)
Procedure: 1.  Cystoscopy with removal of right double-J stent. 2.  Right ureteroscopy with holmium laser application, stone retrieval and insertion of right double-J stent.  Preop diagnosis: 7 mm right proximal ureteral stone and ureteral strictures.  Postop diagnosis: Same.  Surgeon: Dr. Bjorn Pippin.  Anesthesia: General.  Specimen: Stone fragments.  Drains: 6 French by 26 cm right contour double-J stent with tether.  EBL: None.  Complications: None.  Indications: The patient is a 64 year old male who has a 7 mm right proximal stone and on initial attempt ureteroscopy was found to have diffuse stricturing of the ureter that prevented passage of the scope.  He underwent stenting and returns now for ureteroscopy.  Procedure: He was given 4 mg of Cipro IV.  A general anesthetic was induced.  He was placed in lithotomy position and fitted with PAS hose.  His perineum and genitalia were prepped with Betadine solution and the he was draped in usual sterile fashion.  Cystoscopy was initially attempted with a 32 [place] scope but the meatus was tight so was dilated with [employer] sounds from 24-26 [place].  The scope was then passed and inspection demonstrated a normal urethra.  The external sphincter was intact.  The prostatic urethra was short without significant obstruction.  Examination of bladder revealed a smooth wall no mucosal abnormalities.  The left ureteral orifice was unremarkable.  The right ureteral orifice had some erythema and edema around the meatus where there was a stent loop with some mild encrustation.  The stent was grasped with a grasping forceps and pulled the urethral meatus where a sensor wire was then advanced through the stent to the kidney under fluoroscopic guidance.  The 12 French inner core of a 35 cm digital access sheath was then advanced over the wire and easily passed to just below the stone which was readily visible on imaging.  The assembled 14 French sheath  was then passed over the wire and the wire and inner core removed.  The dual-lumen digital flexible ureteroscope was then advanced through the sheath and the stone was readily visualized.  It appeared impacted in the ureter.  A 200 m tract tip laser fiber was then passed through the scope initially set on 0.3 J and 20 Hz.  The stone was fragmented but to complete fragmentation I had increased the energy to 0.5 J.  The fragments were then removed using an engage basket.  Once all fragments had been removed the ureteroscope was advanced the kidney which was inspected.  No residual fragments were identified there but there was an old clot which was removed.  The sensor wire was then advanced back through the scope to the kidney and the scope and sheath were removed under direct vision.  There were some edema at the site of the stone location but no other significant ureteral injuries but it was felt that a stent was indicated.  The cystoscope was then reinserted over the wire and a fresh 6 [place] by 26 cm contour double-J stent with tether was passed the kidney under fluoroscopic guidance.  The wire was removed leaving good coil in the kidney and a good coil in the bladder.  The bladder was drained and the cystoscope was removed leaving the stent string coming out of the meatus.  Final fluoroscopy confirmed good position of the stent.  The string was secured to the patient's penis.  He was taken down from lithotomy position, his anesthetic was reversed and he was moved recovery in  stable condition.  His stone fragments were given to his wife for him to bring to the office for analysis.  There were no complications.

## 2019-09-23 NOTE — Transfer of Care (Signed)
Immediate Anesthesia Transfer of Care Note  Patient: Alejandro Mccullough  Procedure(s) Performed: Procedure(s) (LRB): CYSTOSCOPY/URETEROSCOPY/HOLMIUM LASER/ STENT EXCHANGE STONE BASKET EXTRACTION (Right)  Patient Location: PACU  Anesthesia Type: General  Level of Consciousness: awake, sedated, patient cooperative and responds to stimulation  Airway & Oxygen Therapy: Patient Spontanous Breathing and Patient connected to [place] 02 and soft FM   Post-op Assessment: Report given to PACU RN, Post -op Vital signs reviewed and stable and Patient moving all extremities  Post vital signs: Reviewed and stable  Complications: No apparent anesthesia complications

## 2019-09-24 ENCOUNTER — Encounter (HOSPITAL_BASED_OUTPATIENT_CLINIC_OR_DEPARTMENT_OTHER): Payer: Self-pay | Admitting: Urology

## 2019-10-07 DIAGNOSIS — N135 Crossing vessel and stricture of ureter without hydronephrosis: Secondary | ICD-10-CM | POA: Diagnosis not present

## 2019-10-07 DIAGNOSIS — N201 Calculus of ureter: Secondary | ICD-10-CM | POA: Diagnosis not present

## 2019-10-07 DIAGNOSIS — R8271 Bacteriuria: Secondary | ICD-10-CM | POA: Diagnosis not present

## 2019-10-08 ENCOUNTER — Other Ambulatory Visit (HOSPITAL_COMMUNITY): Payer: Self-pay | Admitting: Cardiology

## 2019-10-08 DIAGNOSIS — I10 Essential (primary) hypertension: Secondary | ICD-10-CM | POA: Diagnosis not present

## 2019-10-08 DIAGNOSIS — N201 Calculus of ureter: Secondary | ICD-10-CM | POA: Diagnosis not present

## 2019-10-09 LAB — BASIC METABOLIC PANEL
BUN/Creatinine Ratio: 9 — ABNORMAL LOW (ref 10–24)
BUN: 9 mg/dL (ref 8–27)
CO2: 24 mmol/L (ref 20–29)
Calcium: 9.5 mg/dL (ref 8.6–10.2)
Chloride: 103 mmol/L (ref 96–106)
Creatinine, Ser: 0.95 mg/dL (ref 0.76–1.27)
GFR calc Af Amer: 99 mL/min/{1.73_m2} (ref >59)
GFR calc non Af Amer: 85 mL/min/{1.73_m2} (ref >59)
Glucose: 98 mg/dL (ref 65–99)
Potassium: 4.7 mmol/L (ref 3.5–5.2)
Sodium: 139 mmol/L (ref 134–144)

## 2019-10-09 LAB — LIPID PANEL
Chol/HDL Ratio: 4.4 ratio (ref 0.0–5.0)
Cholesterol, Total: 161 mg/dL (ref 100–199)
HDL: 37 mg/dL — ABNORMAL LOW (ref >39)
LDL Chol Calc (NIH): 84 mg/dL (ref 0–99)
Triglycerides: 241 mg/dL — ABNORMAL HIGH (ref 0–149)
VLDL Cholesterol Cal: 40 mg/dL (ref 5–40)

## 2019-10-24 ENCOUNTER — Other Ambulatory Visit: Payer: Self-pay

## 2019-10-24 ENCOUNTER — Encounter: Payer: Self-pay | Admitting: Cardiology

## 2019-10-24 ENCOUNTER — Ambulatory Visit: Payer: BC Managed Care – PPO | Admitting: Cardiology

## 2019-10-24 VITALS — BP 128/71 | HR 56 | Resp 17 | Ht 70.0 in | Wt 239.0 lb

## 2019-10-24 DIAGNOSIS — I10 Essential (primary) hypertension: Secondary | ICD-10-CM | POA: Diagnosis not present

## 2019-10-24 DIAGNOSIS — I700 Atherosclerosis of aorta: Secondary | ICD-10-CM | POA: Diagnosis not present

## 2019-10-24 DIAGNOSIS — I712 Thoracic aortic aneurysm, without rupture, unspecified: Secondary | ICD-10-CM

## 2019-10-24 NOTE — Progress Notes (Signed)
Patient referred by Sueanne Margarita, DO for hypertension  Subjective:   Alejandro Mccullough, male    DOB: 09/03/1955, 64 y.o.   MRN: [id number]   Chief Complaint  Patient presents with  . Thoracic aortic aneurysm without rupture  . Follow-up  . Results    labs    HPI   64 y.o. Caucasian male with thoracic aorta aneurysm, hypertension, aortic atherosclerosis.  Blood pressure much improved since addition of amlodipine. He has f/u w/CT surgeon at [facility] in 2022, after repeat CTA.  I have also perform genetic screening to evaluate for familial aortopathy.  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  He does not have any symptoms related to this, although my screening is limited. I had offered genetic counseling referral, but he chose to hold off then.   Initial consultation HPI 12/2018: In 2016, patient was diagnosed with thoracic aortic aneurysm during work-up for palpitations.  He has regular follow-up with cardiothoracic surgery at [facility].  He recently underwent CT scan that showed stable ectatic thoracic aorta aneurysm measuring 4.8 cm.  Patient is a retired Charity fundraiser at [employer].  He moved from [place] to [place] in 2018. He exercises regularly, with walking, bicycle, and weight training with 20 lb dumbbells. He denies chest pain, shortness of breath, palpitations, leg edema, orthopnea, PND, TIA/syncope.    Current Outpatient Medications on File Prior to Visit  Medication Sig Dispense Refill  . amLODipine (NORVASC) 5 MG tablet Take 1 tablet (5 mg total) by mouth daily. 180 tablet 3  . aspirin EC 81 MG tablet Take 1 tablet (81 mg total) by mouth daily. 90 tablet 3  . atenolol (TENORMIN) 50 MG tablet Take 1 tablet (50 mg total) by mouth daily. 90 tablet 3  . atorvastatin (LIPITOR) 40 MG tablet Take 1  tablet (40 mg total) by mouth daily. 90 tablet 3  . Cholecalciferol (VITAMIN D3) 125 MCG (5000 UT) TABS Take by mouth daily.    Marland Kitchen HYDROcodone-acetaminophen (NORCO/VICODIN) 5-325 MG tablet Take 1 tablet by mouth every 6 (six) hours as needed for moderate pain. 8 tablet 0  . losartan (COZAAR) 100 MG tablet Take 1 tablet (100 mg total) by mouth daily. 90 tablet 3  . Methylcellulose, Laxative, (CITRUCEL) 500 MG TABS Take 2 tablets by mouth daily. Citrucel    . ondansetron (ZOFRAN) 4 MG tablet Take 1 tablet (4 mg total) by mouth every 6 (six) hours. (Patient taking differently: Take 4 mg by mouth 4 (four) times daily as needed for nausea or vomiting. ) 12 tablet 0  . phenazopyridine (PYRIDIUM) 200 MG tablet Take 1 tablet (200 mg total) by mouth 3 (three) times daily as needed for pain. 10 tablet 2  . triamcinolone cream (KENALOG) 0.1 % Apply 1 application topically as needed.      No current facility-administered medications on file prior to visit.    Cardiovascular studies:  EKG 01/08/2019: Sinus rhythm 72 bpm.  Incomplete right bundle branch block. Left anterior fascicular block.  Low voltage limb leads.  CTA chest 10/09/2018: Heart/vessels: Normal heart size. Mild coronary arterial calcifications. Aortic valve calcifications. No pericardial effusion. 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 Aortic measurements as follows compared to CT 10/11/2016;  Ascending aorta and the bifurcation of pulmonary artery measures 4.5 cm, ectatic and unchanged Transverse  aorta at the brachycephalic branch measures 3.5 cm, unchanged Proximal descending aorta just 2.8 cm, unchanged Distal descending aorta measures 2.5 cm, unchanged. Conclusion: Similar ectatic ascending aorta measuring up to 4.5 cm compared to CT 10/11/2016. Consider continued annual imaging surveillance.   Recent labs 10/08/2019: Glucose 98, BUN/Cr 9/0.95. EGFR 85. Na/K 139/4.7. Rest of the  CMP normal H/H 15/45. MCV 99. Platelets 237 Chol 161, TG 241, HDL 37, LDL 84    Review of Systems  Cardiovascular: Negative for chest pain, dyspnea on exertion, leg swelling, palpitations and syncope.         Vitals:   10/24/19 0926  BP: 128/71  Pulse: (!) 56  Resp: 17  SpO2: 98%     Body mass index is 34.29 kg/m. Filed Weights   10/24/19 0926  Weight: 239 lb (108.4 kg)     Objective:   Physical Exam Vitals and nursing note reviewed.  Constitutional:      Appearance: He is well-developed.  Neck:     Vascular: No JVD.  Cardiovascular:     Rate and Rhythm: Normal rate and regular rhythm.     Pulses: Intact distal pulses.     Heart sounds: Normal heart sounds. No murmur heard.   Pulmonary:     Effort: Pulmonary effort is normal.     Breath sounds: Normal breath sounds. No wheezing or rales.         Assessment & Recommendations:   64 y.o. Caucasian male with thoracic aorta aneurysm, hypertension, aortic atherosclerosis  Aneurysm of ascending aorta (HCC) 4.8 cm, stable. Conitnue medical management.  No clear family h/o or physical exam features s/o familial aortopathy. Repeat CT angiogram aorta in 2022 as per CT surgery recommendation at [facility]. In the meantime, I will obtain echocardiogram. Genetic screening negative for familial aortopathy. 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  Offered referral to genetic counseling.  Patient would like to hold off for now.   Essential hypertension Very well controlled on amlodipine 5 mg daily, losartan 100 mg, atenolol 50 mg daily.    Aortic atherosclerosis: Continue Aspirin, statin. Lipids reasonably well controlled.  F/u in 1 year  Nigel Mormon, MD [facility] [place] Pager: [phone] Office: [phone]

## 2019-11-18 ENCOUNTER — Ambulatory Visit: Payer: BC Managed Care – PPO

## 2019-11-18 ENCOUNTER — Other Ambulatory Visit: Payer: BC Managed Care – PPO

## 2019-11-18 ENCOUNTER — Other Ambulatory Visit: Payer: Self-pay

## 2019-11-18 DIAGNOSIS — I712 Thoracic aortic aneurysm, without rupture, unspecified: Secondary | ICD-10-CM

## 2019-11-18 DIAGNOSIS — I700 Atherosclerosis of aorta: Secondary | ICD-10-CM

## 2019-11-18 DIAGNOSIS — I10 Essential (primary) hypertension: Secondary | ICD-10-CM | POA: Diagnosis not present

## 2019-11-28 DIAGNOSIS — N5201 Erectile dysfunction due to arterial insufficiency: Secondary | ICD-10-CM | POA: Diagnosis not present

## 2019-11-28 DIAGNOSIS — N201 Calculus of ureter: Secondary | ICD-10-CM | POA: Diagnosis not present

## 2020-01-08 DIAGNOSIS — N202 Calculus of kidney with calculus of ureter: Secondary | ICD-10-CM | POA: Diagnosis not present

## 2020-01-15 DIAGNOSIS — N200 Calculus of kidney: Secondary | ICD-10-CM | POA: Diagnosis not present

## 2020-01-15 DIAGNOSIS — N5201 Erectile dysfunction due to arterial insufficiency: Secondary | ICD-10-CM | POA: Diagnosis not present

## 2020-01-21 DIAGNOSIS — Z125 Encounter for screening for malignant neoplasm of prostate: Secondary | ICD-10-CM | POA: Diagnosis not present

## 2020-01-21 DIAGNOSIS — E785 Hyperlipidemia, unspecified: Secondary | ICD-10-CM | POA: Diagnosis not present

## 2020-01-26 DIAGNOSIS — R82998 Other abnormal findings in urine: Secondary | ICD-10-CM | POA: Diagnosis not present

## 2020-01-26 DIAGNOSIS — Z23 Encounter for immunization: Secondary | ICD-10-CM | POA: Diagnosis not present

## 2020-01-26 DIAGNOSIS — R7989 Other specified abnormal findings of blood chemistry: Secondary | ICD-10-CM | POA: Diagnosis not present

## 2020-01-26 DIAGNOSIS — E559 Vitamin D deficiency, unspecified: Secondary | ICD-10-CM | POA: Diagnosis not present

## 2020-01-26 DIAGNOSIS — E785 Hyperlipidemia, unspecified: Secondary | ICD-10-CM | POA: Diagnosis not present

## 2020-01-26 DIAGNOSIS — Z0000 Encounter for general adult medical examination without abnormal findings: Secondary | ICD-10-CM | POA: Diagnosis not present

## 2020-01-30 DIAGNOSIS — Z1212 Encounter for screening for malignant neoplasm of rectum: Secondary | ICD-10-CM | POA: Diagnosis not present

## 2020-04-23 ENCOUNTER — Other Ambulatory Visit: Payer: Self-pay | Admitting: Cardiology

## 2020-04-23 DIAGNOSIS — I10 Essential (primary) hypertension: Secondary | ICD-10-CM

## 2020-05-10 ENCOUNTER — Other Ambulatory Visit: Payer: Self-pay | Admitting: Cardiology

## 2020-05-10 DIAGNOSIS — I700 Atherosclerosis of aorta: Secondary | ICD-10-CM

## 2020-06-24 ENCOUNTER — Other Ambulatory Visit: Payer: Self-pay | Admitting: Cardiology

## 2020-06-24 DIAGNOSIS — I712 Thoracic aortic aneurysm, without rupture, unspecified: Secondary | ICD-10-CM

## 2020-06-24 DIAGNOSIS — I10 Essential (primary) hypertension: Secondary | ICD-10-CM

## 2020-07-06 DIAGNOSIS — Z85828 Personal history of other malignant neoplasm of skin: Secondary | ICD-10-CM | POA: Diagnosis not present

## 2020-07-06 DIAGNOSIS — C44311 Basal cell carcinoma of skin of nose: Secondary | ICD-10-CM | POA: Diagnosis not present

## 2020-07-06 DIAGNOSIS — L218 Other seborrheic dermatitis: Secondary | ICD-10-CM | POA: Diagnosis not present

## 2020-07-06 DIAGNOSIS — L570 Actinic keratosis: Secondary | ICD-10-CM | POA: Diagnosis not present

## 2020-07-29 DIAGNOSIS — Z20822 Contact with and (suspected) exposure to covid-19: Secondary | ICD-10-CM | POA: Diagnosis not present

## 2020-08-18 DIAGNOSIS — C44311 Basal cell carcinoma of skin of nose: Secondary | ICD-10-CM | POA: Diagnosis not present

## 2020-10-28 NOTE — Progress Notes (Signed)
Patient referred by Sueanne Margarita, DO for hypertension  Subjective:   Alejandro Mccullough, male    DOB: 16-May-1955, 65 y.o.   MRN: [id number]   Chief Complaint  Patient presents with   Hypertension   Thoracic aortic aneurysm without rupture    Follow-up    1 year    HPI   65 y.o. Caucasian male with thoracic aorta aneurysm, hypertension, aortic atherosclerosis.  Patient is doing well. He denies chest pain, shortness of breath, palpitations, leg edema, orthopnea, PND, TIA/syncope.Blood pressure is elevated. He has f/u with CT surgeon w/CT scan as well as f/u w/PCP soon.    Initial consultation HPI 12/2018: In 2016, patient was diagnosed with thoracic aortic aneurysm during work-up for palpitations.  He has regular follow-up with cardiothoracic surgery at [facility].  He recently underwent CT scan that showed stable ectatic thoracic aorta aneurysm measuring 4.8 cm.  Patient is a retired Charity fundraiser at [employer].  He moved from [place] to [place] in 2018. He exercises regularly, with walking, bicycle, and weight training with 20 lb dumbbells. He denies chest pain, shortness of breath, palpitations, leg edema, orthopnea, PND, TIA/syncope.    Current Outpatient Medications on File Prior to Visit  Medication Sig Dispense Refill   aspirin EC 81 MG tablet Take 1 tablet (81 mg total) by mouth daily. 90 tablet 3   atenolol (TENORMIN) 50 MG tablet TAKE 1 TABLET BY MOUTH EVERY DAY 90 tablet 3   atorvastatin (LIPITOR) 40 MG tablet TAKE 1 TABLET BY MOUTH EVERY DAY 90 tablet 3   Cholecalciferol (VITAMIN D3) 125 MCG (5000 UT) TABS Take by mouth daily.     losartan (COZAAR) 100 MG tablet TAKE 1 TABLET BY MOUTH EVERY DAY 90 tablet 3   Methylcellulose, Laxative, (CITRUCEL) 500 MG TABS Take 2 tablets by mouth daily. Citrucel     tadalafil (CIALIS) 5 MG tablet Take 5 mg by mouth daily.     No current facility-administered medications on file prior to visit.    Cardiovascular  studies:  EKG 10/29/2020: Sinus rhythm 53 bpm Left anterior fascicular block  Incomplete right bundle branch block   Echocardiogram 11/18/2019:  Left ventricle cavity is normal in size. Mild basal septal hypertrophy.  Normal global wall motion. Normal LV systolic function with EF 59%. Normal  diastolic function.  Calculated EF 59%.  Ascending aorta dilatation not appreciated on echocardiogram due to  inadequate visualization.  Mildly calcified aortic valve. Difficult to determine number of leaflets  on available images. Trace aortic stenosis. Mean PG 8 mmHg. Mild (Grade I)  aortic regurgitation.  Mild tricuspid regurgitation.  No evidence of pulmonary hypertension.  EKG 01/08/2019: Sinus rhythm 72 bpm.  Incomplete right bundle branch block. Left anterior fascicular block.  Low voltage limb leads.  CTA chest 10/09/2018: Heart/vessels: Normal heart size. Mild coronary arterial calcifications. Aortic valve calcifications. No pericardial effusion. 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 Aortic measurements as follows compared to CT 10/11/2016;  Ascending aorta and the bifurcation of pulmonary artery measures 4.5 cm, ectatic and unchanged Transverse aorta at the brachycephalic branch measures 3.5 cm, unchanged Proximal descending aorta just 2.8 cm, unchanged Distal descending aorta measures 2.5 cm, unchanged. Conclusion: Similar ectatic ascending aorta measuring up to 4.5 cm compared to CT 10/11/2016. Consider continued annual imaging surveillance.   Recent labs 10/08/2019: Glucose 98, BUN/Cr 9/0.95. EGFR 85. Na/K 139/4.7. Rest of the CMP normal H/H 15/45. MCV 99. Platelets 237 Chol 161,  TG 241, HDL 37, LDL 84    Review of Systems  Cardiovascular:  Negative for chest pain, dyspnea on exertion, leg swelling, palpitations and syncope.        Vitals:   10/29/20 0939  BP: (!) 147/72  Pulse: (!) 54  Resp: 16  Temp: 98 F  (36.7 C)  SpO2: 97%    Body mass index is 32.86 kg/m. Filed Weights   10/29/20 0939  Weight: 229 lb (103.9 kg)     Objective:   Physical Exam Vitals and nursing note reviewed.  Constitutional:      Appearance: He is well-developed.  Neck:     Vascular: No JVD.  Cardiovascular:     Rate and Rhythm: Normal rate and regular rhythm.     Pulses: Intact distal pulses.     Heart sounds: Normal heart sounds. No murmur heard. Pulmonary:     Effort: Pulmonary effort is normal.     Breath sounds: Normal breath sounds. No wheezing or rales.        Assessment & Recommendations:   65 y.o. Caucasian male with thoracic aorta aneurysm, hypertension, aortic atherosclerosis  Aneurysm of ascending aorta (HCC) 4.8 cm, stable. Conitnue medical management.  No clear family h/o or physical exam features s/o familial aortopathy. Repeat CT angiogram aorta in 2022 as per CT surgery recommendation at [facility].  Genetic screening negative for familial aortopathy. 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  Offered referral to genetic counseling.  Patient would like to hold off for now.   Essential hypertension Added HCTZ 25 mg daily. Continue losartan 100 mg, atenolol 50 mg daily.   Check BMP in 1 week If well controlled on this, could ask Dr. Francesco Sor to prescribe combination Losartan-HCTZ at upcoming office visit in 12/2020.  Aortic atherosclerosis: Continue Aspirin, statin. Lipids reasonably well controlled.  F/u in 1 year  Nigel Mormon, MD [facility] [place] Pager: [phone] Office: [phone]

## 2020-10-29 ENCOUNTER — Encounter: Payer: Self-pay | Admitting: Cardiology

## 2020-10-29 ENCOUNTER — Other Ambulatory Visit: Payer: Self-pay

## 2020-10-29 ENCOUNTER — Ambulatory Visit: Payer: BC Managed Care – PPO | Admitting: Cardiology

## 2020-10-29 VITALS — BP 147/72 | HR 54 | Temp 98.0°F | Resp 16 | Ht 70.0 in | Wt 229.0 lb

## 2020-10-29 DIAGNOSIS — I700 Atherosclerosis of aorta: Secondary | ICD-10-CM | POA: Diagnosis not present

## 2020-10-29 DIAGNOSIS — I712 Thoracic aortic aneurysm, without rupture, unspecified: Secondary | ICD-10-CM

## 2020-10-29 DIAGNOSIS — I10 Essential (primary) hypertension: Secondary | ICD-10-CM

## 2020-10-29 MED ORDER — HYDROCHLOROTHIAZIDE 25 MG PO TABS: 25.0000 mg | ORAL_TABLET | Freq: Every day | ORAL | 3 refills | Status: DC

## 2020-11-09 DIAGNOSIS — I10 Essential (primary) hypertension: Secondary | ICD-10-CM | POA: Diagnosis not present

## 2020-11-10 LAB — BASIC METABOLIC PANEL
BUN/Creatinine Ratio: 19 (ref 10–24)
BUN: 21 mg/dL (ref 8–27)
CO2: 26 mmol/L (ref 20–29)
Calcium: 10.2 mg/dL (ref 8.6–10.2)
Chloride: 100 mmol/L (ref 96–106)
Creatinine, Ser: 1.12 mg/dL (ref 0.76–1.27)
Glucose: 97 mg/dL (ref 65–99)
Potassium: 4.4 mmol/L (ref 3.5–5.2)
Sodium: 140 mmol/L (ref 134–144)
eGFR: 74 mL/min/{1.73_m2} (ref >59)

## 2020-11-11 NOTE — Telephone Encounter (Signed)
From pt

## 2020-11-17 DIAGNOSIS — I358 Other nonrheumatic aortic valve disorders: Secondary | ICD-10-CM | POA: Diagnosis not present

## 2020-11-17 DIAGNOSIS — Z880 Allergy status to penicillin: Secondary | ICD-10-CM | POA: Diagnosis not present

## 2020-11-17 DIAGNOSIS — I517 Cardiomegaly: Secondary | ICD-10-CM | POA: Diagnosis not present

## 2020-11-17 DIAGNOSIS — I712 Thoracic aortic aneurysm, without rupture: Secondary | ICD-10-CM | POA: Diagnosis not present

## 2021-01-25 DIAGNOSIS — E785 Hyperlipidemia, unspecified: Secondary | ICD-10-CM | POA: Diagnosis not present

## 2021-01-25 DIAGNOSIS — E559 Vitamin D deficiency, unspecified: Secondary | ICD-10-CM | POA: Diagnosis not present

## 2021-01-25 DIAGNOSIS — Z125 Encounter for screening for malignant neoplasm of prostate: Secondary | ICD-10-CM | POA: Diagnosis not present

## 2021-02-01 ENCOUNTER — Other Ambulatory Visit: Payer: Self-pay | Admitting: Cardiology

## 2021-02-01 DIAGNOSIS — N402 Nodular prostate without lower urinary tract symptoms: Secondary | ICD-10-CM | POA: Diagnosis not present

## 2021-02-01 DIAGNOSIS — R3121 Asymptomatic microscopic hematuria: Secondary | ICD-10-CM | POA: Diagnosis not present

## 2021-02-01 DIAGNOSIS — Z0000 Encounter for general adult medical examination without abnormal findings: Secondary | ICD-10-CM | POA: Diagnosis not present

## 2021-02-01 DIAGNOSIS — Z23 Encounter for immunization: Secondary | ICD-10-CM | POA: Diagnosis not present

## 2021-02-01 DIAGNOSIS — R82998 Other abnormal findings in urine: Secondary | ICD-10-CM | POA: Diagnosis not present

## 2021-02-01 DIAGNOSIS — I10 Essential (primary) hypertension: Secondary | ICD-10-CM

## 2021-03-09 IMAGING — DX DG ABDOMEN 1V
2 series · 2 of 2 positions shown · non-contrast
Comparison: CT 08/29/2019

CLINICAL DATA: Right-sided abdominal pain 3 days. Known recent
right UPJ stone.

EXAM:
ABDOMEN - 1 VIEW

[abdomen kub (1 of 2)]
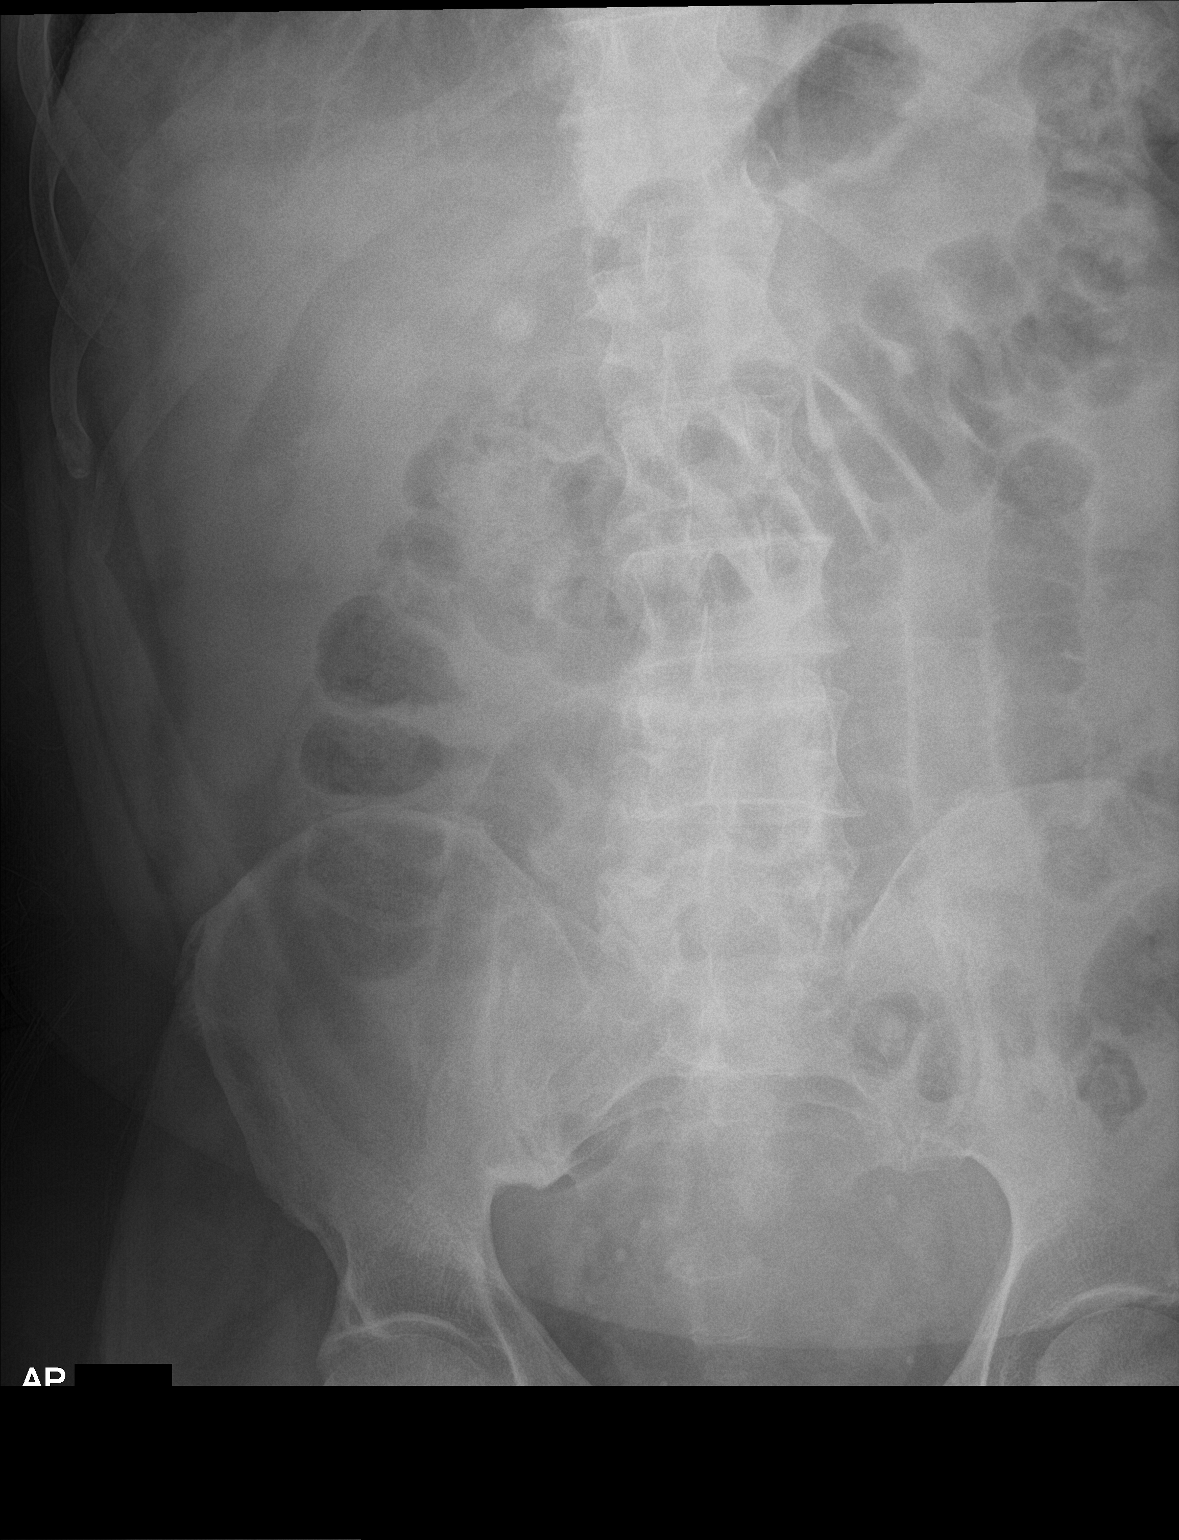

[abdomen kub (2 of 2)]
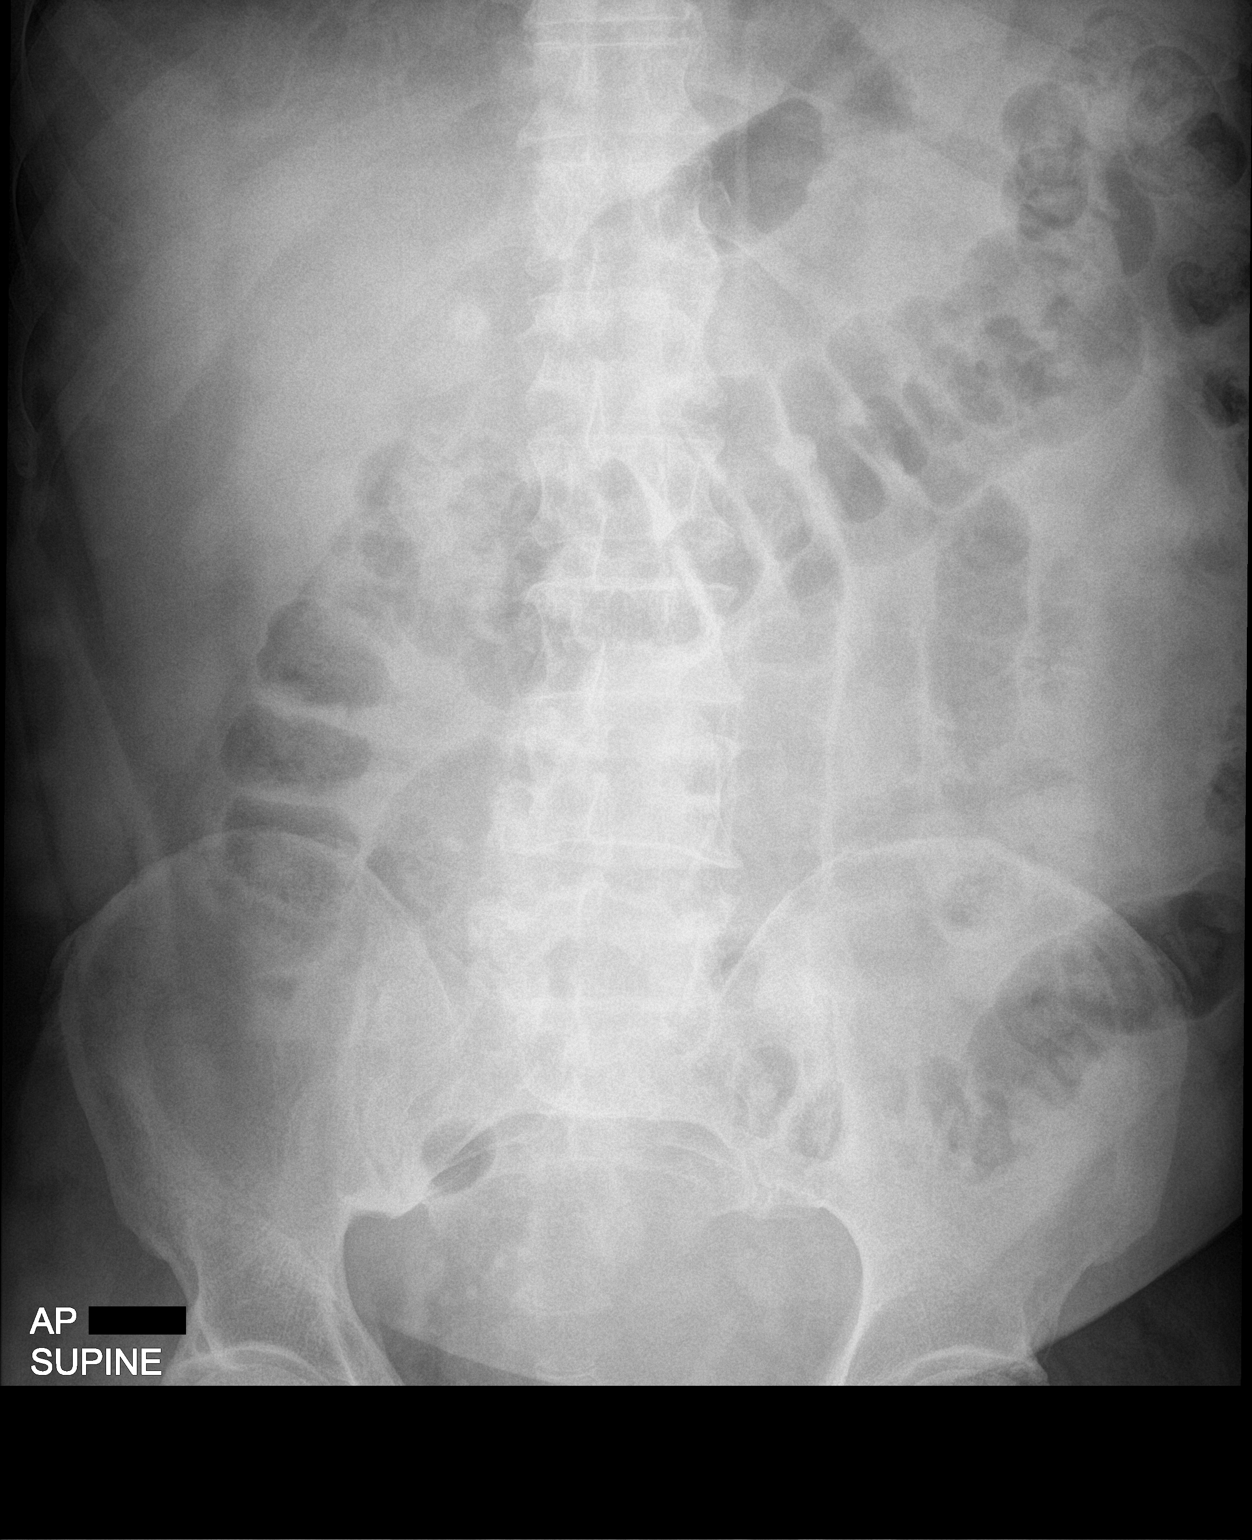

[2 of 2 positions shown; findings below may reference images not displayed]

FINDINGS: Bowel gas pattern demonstrates air throughout the colon. There are
several air-filled mildly prominent but nondilated central small
bowel loops no free Peri single gallstone over the right upper
quadrant no definite renal stones. Patient's previously noted right
UPJ stone not well visualized. Several pelvic phleboliths are
present. Remainder of the exam is unchanged.
IMPRESSION: 1.  Nonspecific, nonobstructive bowel gas pattern.

2.  Single gallstone.

## 2021-03-09 IMAGING — CT CT RENAL STONE PROTOCOL
2 of 4 series · 16 of 46 positions shown, 18 images · non-contrast
Comparison: CT abdomen pelvis dated 08/29/2019

CLINICAL DATA: Flank pain

EXAM:
CT ABDOMEN AND PELVIS WITHOUT CONTRAST
TECHNIQUE: Multidetector CT imaging of the abdomen and pelvis was performed
following the standard protocol without IV contrast.

[Series 2: axial st · axial · 0.84mm/px · z∈[-502,-92]mm · 13 of 94 slices shown, 15 images]
[im 6/94  soft-tissue]
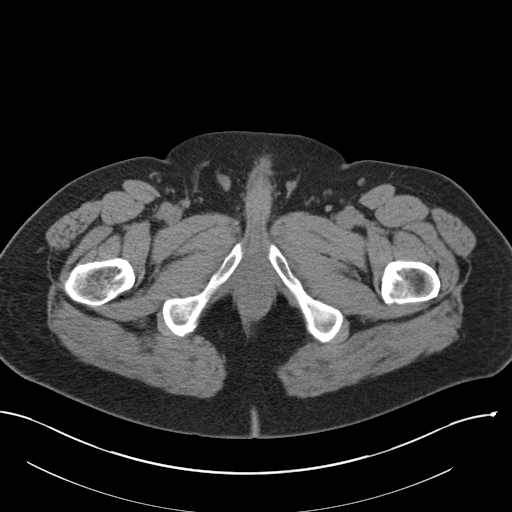
[im 6/94  bone]
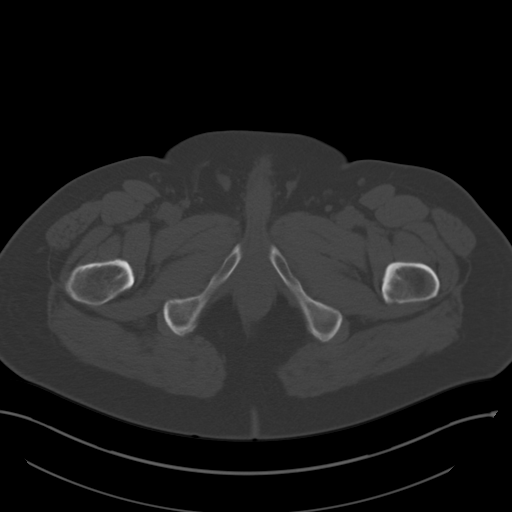
[im 12/94  soft-tissue]
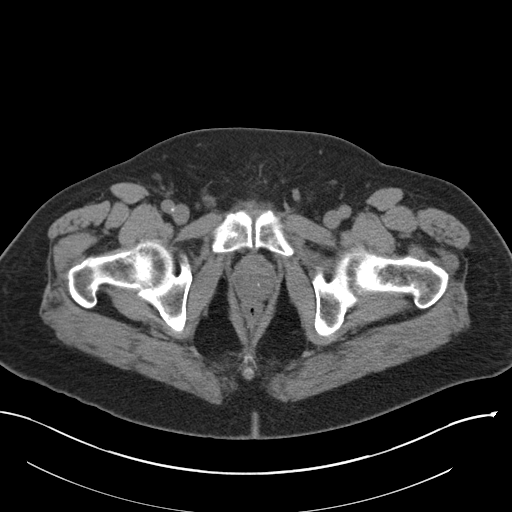
[im 18/94  soft-tissue]
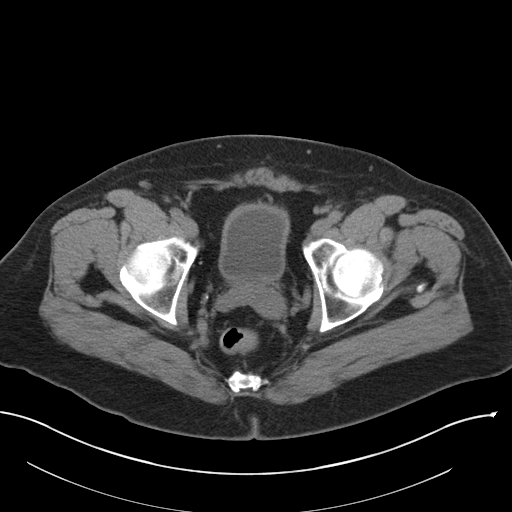
[im 30/94  soft-tissue]
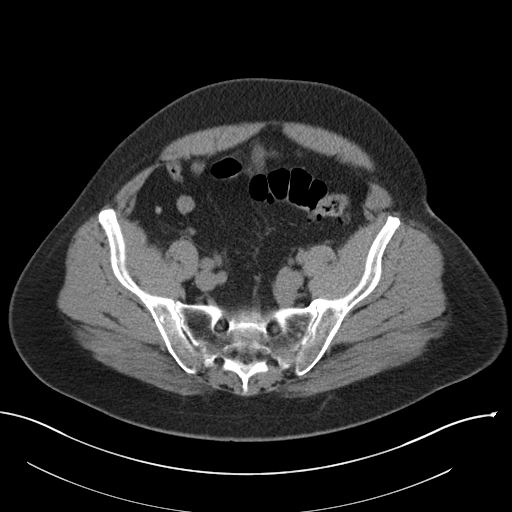
[im 35/94  soft-tissue]
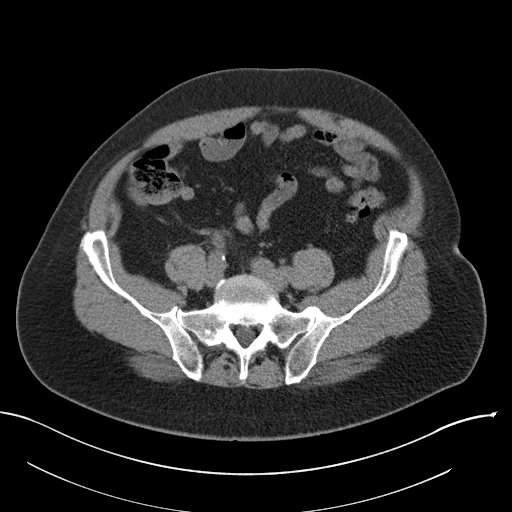
[im 41/94  soft-tissue]
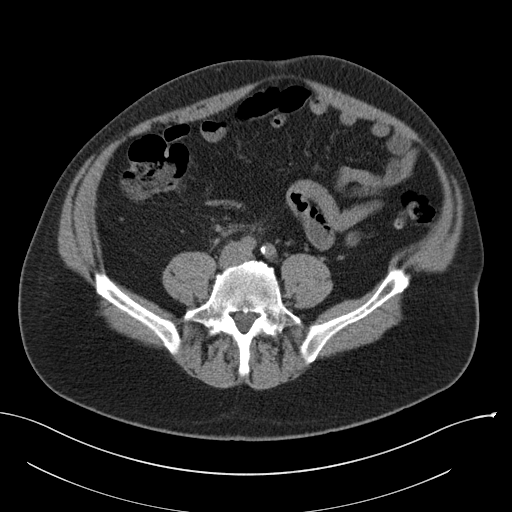
[im 47/94  soft-tissue]
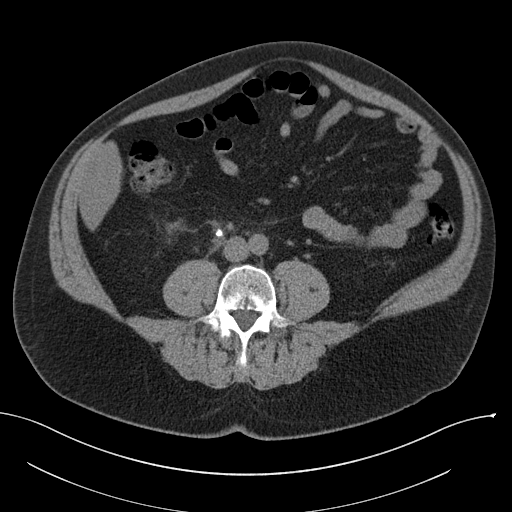
[im 53/94  soft-tissue]
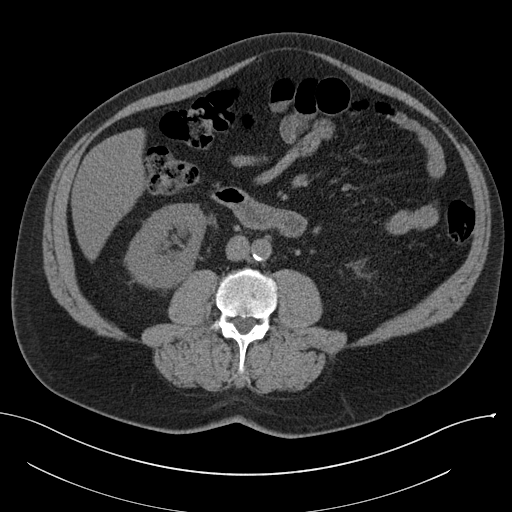
[im 59/94  soft-tissue]
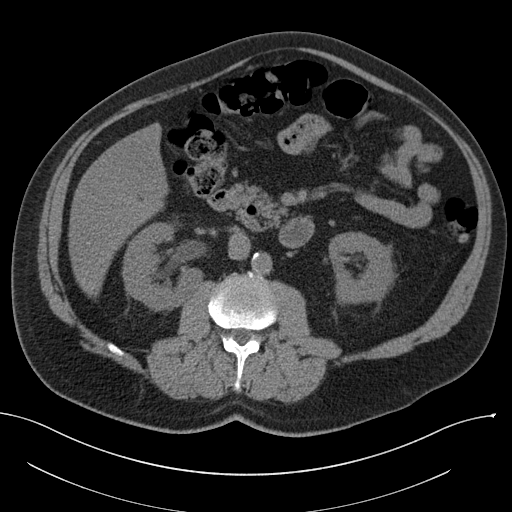
[im 59/94  bone]
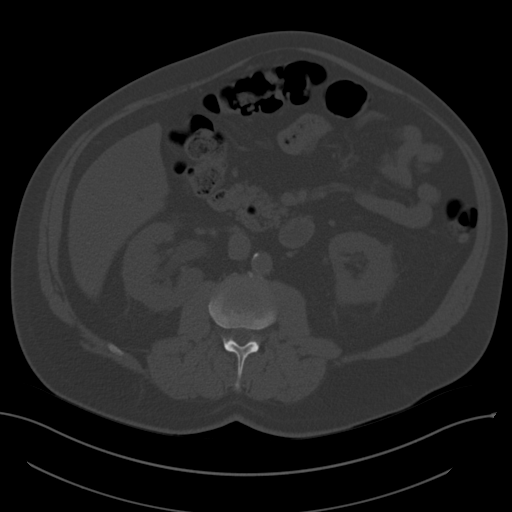
[im 64/94  soft-tissue]
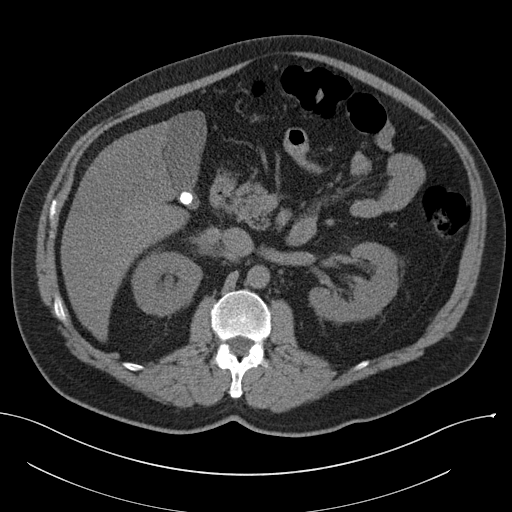
[im 76/94  soft-tissue]
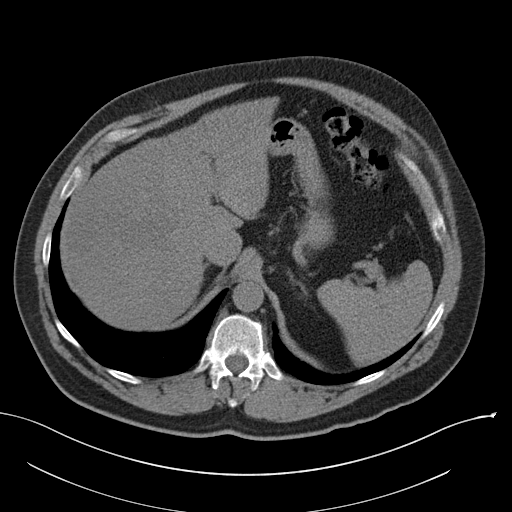
[im 82/94  soft-tissue]
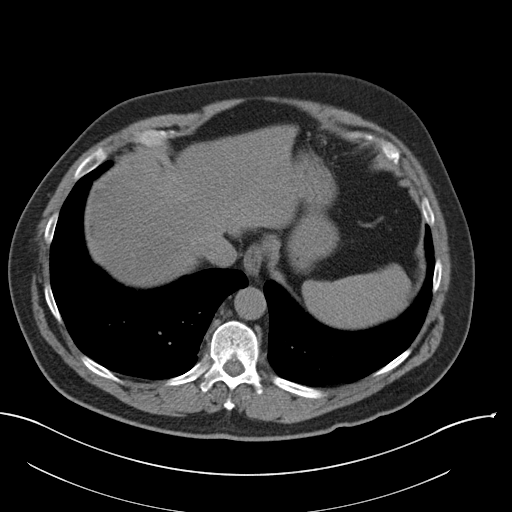
[im 88/94  soft-tissue]
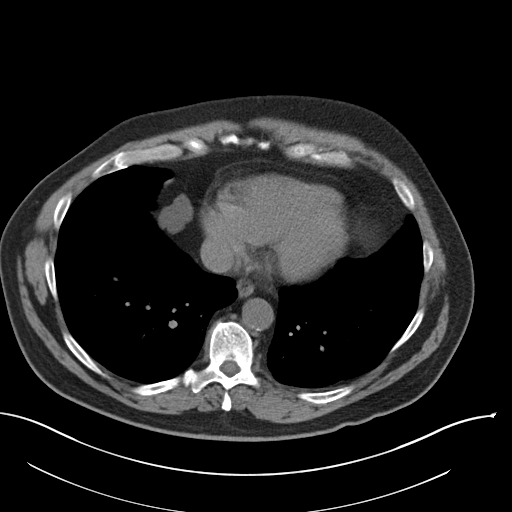

[Series 5: coronal · coronal · 0.84mm/px · 3 of 168 slices shown]
[im 56/168  soft-tissue]
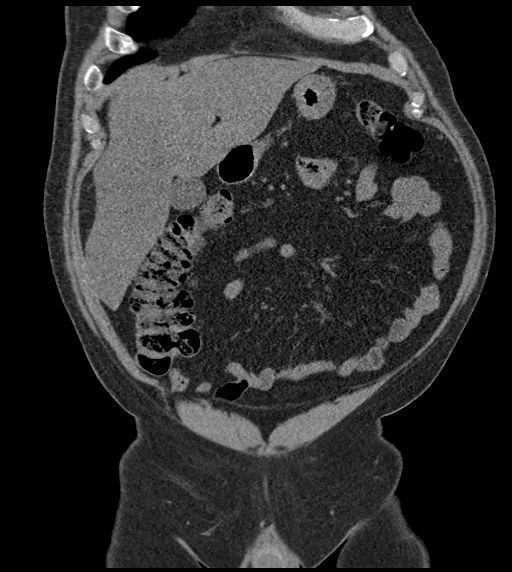
[im 75/168  soft-tissue]
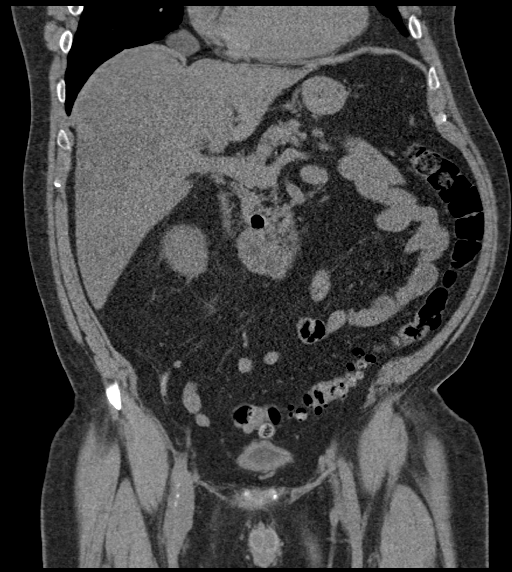
[im 93/168  soft-tissue]
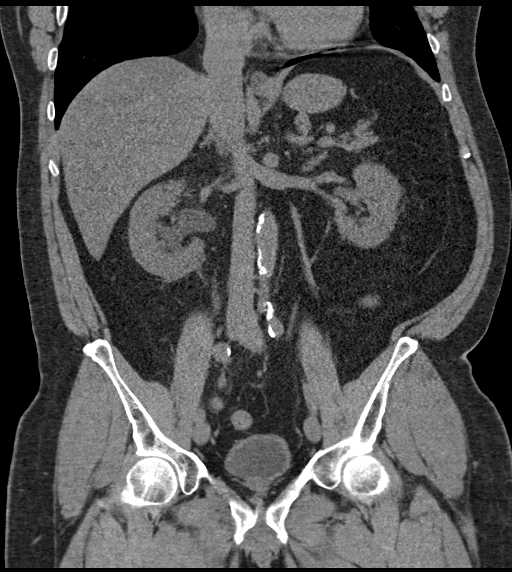

[16 of 46 positions shown; findings below may reference images not displayed]

FINDINGS: Lower chest: Minimal bibasilar atelectasis.

Hepatobiliary: Hepatic steatosis is seen in the anterior aspect of
the right hepatic lobe. A gallstone is seen in the gallbladder neck.
No gallbladder wall thickening or biliary dilatation.

Pancreas: Unremarkable. No pancreatic ductal dilatation or
surrounding inflammatory changes.

Spleen: Normal in size without focal abnormality.

Adrenals/Urinary Tract: Adrenal glands are unremarkable. An
obstructing 7 mm calculus in the proximal right ureter results in
mild right hydronephrosis, unchanged from 08/29/2019. Surrounding
periureteral fat stranding is increased. Punctate nonobstructive
renal calculi are redemonstrated, 2 on the left and 1 on the right.
There is no left hydronephrosis. Bladder is nondistended.

Stomach/Bowel: Stomach is within normal limits. Appendix appears
normal. No evidence of bowel wall thickening, distention, or
inflammatory changes.

Vascular/Lymphatic: Aortic atherosclerosis. No enlarged abdominal or
pelvic lymph nodes.

Reproductive: Prostate is unremarkable.

Other: No abdominal wall hernia or abnormality. No abdominopelvic
ascites.

Musculoskeletal: No acute or significant osseous findings.
IMPRESSION: 1. Obstructing 7 mm calculus in the proximal right ureter results in
mild right hydronephrosis, unchanged from 08/29/2019. Surrounding
periureteral fat stranding is increased.

Aortic Atherosclerosis ([id number].L).

## 2021-04-24 ENCOUNTER — Other Ambulatory Visit: Payer: Self-pay | Admitting: Cardiology

## 2021-04-24 DIAGNOSIS — I700 Atherosclerosis of aorta: Secondary | ICD-10-CM

## 2021-05-25 DIAGNOSIS — N200 Calculus of kidney: Secondary | ICD-10-CM | POA: Diagnosis not present

## 2021-05-25 DIAGNOSIS — N5201 Erectile dysfunction due to arterial insufficiency: Secondary | ICD-10-CM | POA: Diagnosis not present

## 2021-05-30 DIAGNOSIS — I10 Essential (primary) hypertension: Secondary | ICD-10-CM | POA: Diagnosis not present

## 2021-06-09 ENCOUNTER — Other Ambulatory Visit: Payer: Self-pay | Admitting: Cardiology

## 2021-06-09 DIAGNOSIS — I10 Essential (primary) hypertension: Secondary | ICD-10-CM

## 2021-06-09 DIAGNOSIS — I712 Thoracic aortic aneurysm, without rupture, unspecified: Secondary | ICD-10-CM

## 2021-07-14 ENCOUNTER — Ambulatory Visit (INDEPENDENT_AMBULATORY_CARE_PROVIDER_SITE_OTHER): Payer: BC Managed Care – PPO

## 2021-07-14 ENCOUNTER — Ambulatory Visit: Payer: BC Managed Care – PPO

## 2021-07-14 ENCOUNTER — Ambulatory Visit: Payer: BC Managed Care – PPO | Admitting: Podiatry

## 2021-07-14 DIAGNOSIS — M25572 Pain in left ankle and joints of left foot: Secondary | ICD-10-CM

## 2021-07-14 DIAGNOSIS — G8929 Other chronic pain: Secondary | ICD-10-CM

## 2021-07-14 DIAGNOSIS — M7752 Other enthesopathy of left foot: Secondary | ICD-10-CM | POA: Diagnosis not present

## 2021-07-14 MED ORDER — MELOXICAM 15 MG PO TABS: 15.0000 mg | ORAL_TABLET | Freq: Every day | ORAL | 0 refills | Status: AC | PRN

## 2021-07-14 MED ORDER — TRIAMCINOLONE ACETONIDE 10 MG/ML IJ SUSP
10.0000 mg | Freq: Once | INTRAMUSCULAR | Status: AC
  Administered 2021-07-14: 10 mg

## 2021-07-14 NOTE — Patient Instructions (Signed)
For instructions on how to put on your Tri-Lock Ankle Brace, please visit [email or web address]  For inserts I like powersteps, superfeet, aetrex  WEARING INSTRUCTIONS FOR ORTHOTICS  Don't expect to be comfortable wearing your orthotic devices for the first time.  Like eyeglasses, you may be aware of them as time passes, they will not be uncomfortable and you will enjoy wearing them.  FOLLOW THESE INSTRUCTIONS EXACTLY!  Wear your orthotic devices for:       Not more than 1 hour the first day.       Not more than 2 hours the second day.       Not more than 3 hours the third day and so on.        Or wear them for as long as they feel comfortable.       If you experience discomfort in your feet or legs take them out.  When feet & legs feel       better, put them back in.  You do need to be consistent and wear them a little        everyday. 2.   If at any time the orthotic devices become acutely uncomfortable before the       time for that particular day, STOP WEARING THEM. 3.   On the next day, do not increase the wearing time. 4.   Subsequently, increase the wearing time by 15-30 minutes only if comfortable to do       so. 5.   You will be seen by your doctor about 2-4 weeks after you receive your orthotic       devices, at which time you will probably be wearing your devices comfortably        for about 8 hours or more a day. 6.   Some patients occasionally report mild aches or discomfort in other parts of the of       body such as the knees, hips or back after 3 or 4 consecutive hours of wear.  If this       is the case with you, do not extend your wearing time.  Instead, cut it back an hour or       two.  In all likelihood, these symptoms will disappear in a short period of time as your       body posture realigns itself and functions more efficiently. 7.   It is possible that your orthotic device may require some small changes or adjustment       to improve their function or make  them more comfortable.   This is usually not done       before one to three months have elapsed.  These adjustments are made in        accordance with the changed position your feet are assuming as a result of       improved biomechanical function. 8.   In women's shoes, it's not unusual for your heel to slip out of the shoe, particularly if       they are step-in-shoes.  If this is the case, try other shoes or other styles.  Try to       purchase shoes which have deeper heal seats or higher heel counters. 9.   Squeaking of orthotics devices in the shoes is due to the movement of the devices       when they are functioning normally.  To eliminate squeaking, simply dust some  baby powder into your shoes before inserting the devices.  If this does not work,        apply soap or wax to the edges of the orthotic devices or put a tissue into the shoes. 10. It is important that you follow these directions explicitly.  Failure to do so will simply       prolong the adjustment period or create problems which are easily avoided.  It makes       no difference if you are wearing your orthotic devices for only a few hours after        several months, so long as you are wearing them comfortably for those hours. 11. If you have any questions or complaints, contact our office.  We have no way of       knowing about your problems unless you tell us.  If we do not hear from you, we will       assume that you are proceeding well.

## 2021-07-17 NOTE — Progress Notes (Signed)
Subjective:   Patient ID: Alejandro Mccullough, male   DOB: 66 y.o.   MRN: [id number]   HPI 66 year old male presents the office today with concerns of left ankle pain which started at 1 and half years ago he gets pain in the front of the ankle describes a sharp pain.  He has tried numbing cream without significant improvement.  No recent injuries that he reports.   Review of Systems  All other systems reviewed and are negative.  Past Medical History:  Diagnosis Date   Aneurysm of ascending aorta (HCC) per chest ct 10-09-2018 [facility]   ascending aortic 4.5 cm, transverse aorta 3.5 cm, proximal descending aorta 2.8 cm, distal descending aorta 2.5 cm   History of kidney stones    Hyperlipidemia    Hypertension    Sleep apnea    cpcp set on 10    Past Surgical History:  Procedure Laterality Date   CYSTOSCOPY WITH RETROGRADE PYELOGRAM, URETEROSCOPY AND STENT PLACEMENT Right 09/01/2019   Procedure: CYSTOSCOPY WITH RETROGRADE PYELOGRAM WITH INTERPRETATION, RIGHT URETEROSCOPY AND RIGHT STENT PLACEMENT;  Surgeon: Bjorn Pippin, MD;  Location: [facility];  Service: Urology;  Laterality: Right;   CYSTOSCOPY/URETEROSCOPY/HOLMIUM LASER/STENT PLACEMENT Right 09/23/2019   Procedure: CYSTOSCOPY/URETEROSCOPY/HOLMIUM LASER/ STENT EXCHANGE STONE BASKET EXTRACTION;  Surgeon: Bjorn Pippin, MD;  Location: [facility];  Service: Urology;  Laterality: Right;   other     oral surgery   TONSILLECTOMY     at the age of 59     Current Outpatient Medications:    meloxicam (MOBIC) 15 MG tablet, Take 1 tablet (15 mg total) by mouth daily as needed for pain., Disp: 30 tablet, Rfl: 0   aspirin EC 81 MG tablet, Take 1 tablet (81 mg total) by mouth daily., Disp: 90 tablet, Rfl: 3   atenolol (TENORMIN) 50 MG tablet, TAKE 1 TABLET BY MOUTH EVERY DAY, Disp: 90 tablet, Rfl: 3   atorvastatin (LIPITOR) 40 MG tablet, TAKE 1 TABLET BY MOUTH EVERY DAY, Disp: 90 tablet, Rfl: 3   Cholecalciferol (VITAMIN D3) 125 MCG  (5000 UT) TABS, Take by mouth daily., Disp: , Rfl:    hydrochlorothiazide (HYDRODIURIL) 25 MG tablet, TAKE 1 TABLET (25 MG TOTAL) BY MOUTH DAILY., Disp: 90 tablet, Rfl: 1   losartan (COZAAR) 100 MG tablet, TAKE 1 TABLET BY MOUTH EVERY DAY, Disp: 90 tablet, Rfl: 3   Methylcellulose, Laxative, (CITRUCEL) 500 MG TABS, Take 2 tablets by mouth daily. Citrucel, Disp: , Rfl:    tadalafil (CIALIS) 5 MG tablet, Take 5 mg by mouth daily., Disp: , Rfl:   Allergies  Allergen Reactions   Penicillin G Rash          Objective:  Physical Exam  General: AAO x3, NAD  Dermatological: Skin is warm, dry and supple bilateral. There are no open sores, no preulcerative lesions, no rash or signs of infection present.  Vascular: Dorsalis Pedis artery and Posterior Tibial artery pedal pulses are 2/4 bilateral with immedate capillary fill time.  There is no pain with calf compression, swelling, warmth, erythema.   Neruologic: Grossly intact via light touch bilateral.   Musculoskeletal: Joint tenderness is localized along the lateral aspect of the ankle along the area the sinus tarsi on the left side there is minimal edema.  There is no erythema or warmth.  No significant pain with ankle joint range of motion.  No other areas of pinpoint tenderness.  Muscular strength 5/5 in all groups tested bilateral.  Gait: Unassisted, Nonantalgic.  Assessment:   Left ankle capsulitis     Plan:  -Treatment options discussed including all alternatives, risks, and complications -Etiology of symptoms were discussed -X-rays were obtained and reviewed with the patient.  3 views of the left ankle were obtained.  No evidence of acute fracture.  Calcaneal spurring is noted. -Steroid injection performed the sinus tarsi.  Skin was cleaned with Betadine, alcohol and mixture of 1 cc Kenalog 10, 0.5 cc of Marcaine plain, 0.5 cc of lidocaine plain was infiltrated to the sinus tarsi without complications.  Postinjection care  discussed.  Tolerated well. -Tri-Lock ankle brace dispensed for now to help in stability and help facilitate soft tissue healing. -Long-term discussed shoe modifications and good arch support.  We discussed various arch supports.  Insert over-the-counter but we also discussed getting custom if needed.    Return in about 6 weeks (around 08/25/2021), or if symptoms worsen or fail to improve.  Vivi Barrack DPM

## 2021-07-19 DIAGNOSIS — L570 Actinic keratosis: Secondary | ICD-10-CM | POA: Diagnosis not present

## 2021-07-19 DIAGNOSIS — D1801 Hemangioma of skin and subcutaneous tissue: Secondary | ICD-10-CM | POA: Diagnosis not present

## 2021-07-19 DIAGNOSIS — D225 Melanocytic nevi of trunk: Secondary | ICD-10-CM | POA: Diagnosis not present

## 2021-07-19 DIAGNOSIS — L821 Other seborrheic keratosis: Secondary | ICD-10-CM | POA: Diagnosis not present

## 2021-07-19 DIAGNOSIS — Z85828 Personal history of other malignant neoplasm of skin: Secondary | ICD-10-CM | POA: Diagnosis not present

## 2021-07-26 ENCOUNTER — Other Ambulatory Visit: Payer: Self-pay | Admitting: Podiatry

## 2021-07-26 DIAGNOSIS — M25572 Pain in left ankle and joints of left foot: Secondary | ICD-10-CM

## 2021-07-29 ENCOUNTER — Other Ambulatory Visit: Payer: Self-pay | Admitting: Cardiology

## 2021-07-29 DIAGNOSIS — I10 Essential (primary) hypertension: Secondary | ICD-10-CM

## 2021-08-11 DIAGNOSIS — R058 Other specified cough: Secondary | ICD-10-CM | POA: Diagnosis not present

## 2021-08-11 DIAGNOSIS — J40 Bronchitis, not specified as acute or chronic: Secondary | ICD-10-CM | POA: Diagnosis not present

## 2021-11-02 ENCOUNTER — Ambulatory Visit: Payer: Medicare Other | Admitting: Cardiology

## 2021-11-02 ENCOUNTER — Encounter: Payer: Self-pay | Admitting: Cardiology

## 2021-11-02 VITALS — BP 114/69 | HR 51 | Resp 16 | Ht 70.0 in

## 2021-11-02 DIAGNOSIS — I10 Essential (primary) hypertension: Secondary | ICD-10-CM

## 2021-11-02 DIAGNOSIS — I700 Atherosclerosis of aorta: Secondary | ICD-10-CM

## 2021-11-02 DIAGNOSIS — I7121 Aneurysm of the ascending aorta, without rupture: Secondary | ICD-10-CM

## 2021-11-02 MED ORDER — LOSARTAN POTASSIUM-HCTZ 100-25 MG PO TABS: 1.0000 | ORAL_TABLET | Freq: Every day | ORAL | 3 refills | Status: DC

## 2021-11-02 NOTE — Progress Notes (Signed)
Patient referred by Sueanne Margarita, DO for hypertension  Subjective:   Alejandro Mccullough, male    DOB: 1956-02-12, 66 y.o.   MRN: [id number]   Chief Complaint  Patient presents with   Hypertension   Aneurysm of ascending aorta   Follow-up    1  year    HPI   66 y.o. Caucasian male with thoracic aorta aneurysm, hypertension, aortic atherosclerosis.  Patient is doing well. He denies chest pain, shortness of breath, palpitations, leg edema, orthopnea, PND, TIA/syncope.Blood pressure is well controlled. He has not had any CT scan aorta since 2022, has next follow up with surgeons at [facility] in 2024.    Initial consultation HPI 12/2018: In 2016, patient was diagnosed with thoracic aortic aneurysm during work-up for palpitations.  He has regular follow-up with cardiothoracic surgery at [facility].  He recently underwent CT scan that showed stable ectatic thoracic aorta aneurysm measuring 4.8 cm.  Patient is a retired Charity fundraiser at [employer].  He moved from [place] to [place] in 2018. He exercises regularly, with walking, bicycle, and weight training with 20 lb dumbbells. He denies chest pain, shortness of breath, palpitations, leg edema, orthopnea, PND, TIA/syncope.    Current Outpatient Medications:    aspirin EC 81 MG tablet, Take 1 tablet (81 mg total) by mouth daily., Disp: 90 tablet, Rfl: 3   atenolol (TENORMIN) 50 MG tablet, TAKE 1 TABLET BY MOUTH EVERY DAY, Disp: 90 tablet, Rfl: 3   atorvastatin (LIPITOR) 40 MG tablet, TAKE 1 TABLET BY MOUTH EVERY DAY, Disp: 90 tablet, Rfl: 3   Cholecalciferol (VITAMIN D3) 125 MCG (5000 UT) TABS, Take by mouth daily., Disp: , Rfl:    hydrochlorothiazide (HYDRODIURIL) 25 MG tablet, TAKE 1 TABLET (25 MG TOTAL) BY MOUTH DAILY., Disp: 90 tablet, Rfl: 1   losartan (COZAAR) 100 MG tablet, TAKE 1 TABLET BY MOUTH EVERY DAY, Disp: 90 tablet, Rfl: 3   meloxicam (MOBIC) 15 MG tablet, Take 1 tablet (15 mg total) by mouth daily as  needed for pain., Disp: 30 tablet, Rfl: 0   Methylcellulose, Laxative, (CITRUCEL) 500 MG TABS, Take 2 tablets by mouth daily. Citrucel, Disp: , Rfl:    tadalafil (CIALIS) 5 MG tablet, Take 5 mg by mouth daily., Disp: , Rfl:   Cardiovascular studies:  EKG 11/02/2021: Sinus rhythm 51 bpm Incomplete right bundle branch block  Echocardiogram 11/18/2019:  Left ventricle cavity is normal in size. Mild basal septal hypertrophy.  Normal global wall motion. Normal LV systolic function with EF 59%. Normal  diastolic function.  Calculated EF 59%.  Ascending aorta dilatation not appreciated on echocardiogram due to  inadequate visualization.  Mildly calcified aortic valve. Difficult to determine number of leaflets  on available images. Trace aortic stenosis. Mean PG 8 mmHg. Mild (Grade I)  aortic regurgitation.  Mild tricuspid regurgitation.  No evidence of pulmonary hypertension.  CTA chest 10/09/2018: Heart/vessels: Normal heart size. Mild coronary arterial calcifications. Aortic valve calcifications. No pericardial effusion. 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 Aortic measurements as follows compared to CT 10/11/2016;  Ascending aorta and the bifurcation of pulmonary artery measures 4.5 cm, ectatic and unchanged Transverse aorta at the brachycephalic branch measures 3.5 cm, unchanged Proximal descending aorta just 2.8 cm, unchanged Distal descending aorta measures 2.5 cm, unchanged. Conclusion: Similar ectatic ascending aorta measuring up to 4.5 cm compared to CT 10/11/2016. Consider continued annual imaging surveillance.   Recent labs: 11/09/2020: Glucose 97, BUN/Cr 21/1.12.  EGFR 74. Na/K 140/4.4.    Review of Systems  Cardiovascular:  Negative for chest pain, dyspnea on exertion, leg swelling, palpitations and syncope.         Vitals:   11/02/21 0932  BP: 114/69  Pulse: (!) 51  Resp: 16  SpO2: 97%     Body mass index is  32.86 kg/m.   Objective:   Physical Exam Vitals and nursing note reviewed.  Constitutional:      Appearance: He is well-developed.  Neck:     Vascular: No JVD.  Cardiovascular:     Rate and Rhythm: Normal rate and regular rhythm.     Pulses: Intact distal pulses.     Heart sounds: Normal heart sounds. No murmur heard. Pulmonary:     Effort: Pulmonary effort is normal.     Breath sounds: Normal breath sounds. No wheezing or rales.         Assessment & Recommendations:   66 y.o. Caucasian male with thoracic aorta aneurysm, hypertension, aortic atherosclerosis  Aneurysm of ascending aorta (HCC) 4.8 cm, stable. Conitnue medical management.  No clear family h/o or physical exam features s/o familial aortopathy. Repeat CT angiogram aorta  Genetic screening negative for familial aortopathy. 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  Previously offered referral to genetic counseling.  Patient would like to hold off for now.   Essential hypertension Well controlled. Changed losartan 100 and HCTZ 25 to losartan-HCTZ 100-25 for convenience.  Aortic atherosclerosis: Continue Aspirin, statin. Check lipid panel  F/u in 1 year  Nigel Mormon, MD [facility] [place] Pager: [phone] Office: [phone]

## 2021-11-24 ENCOUNTER — Ambulatory Visit
Admission: RE | Admit: 2021-11-24 | Discharge: 2021-11-24 | Disposition: A | Discharge status: Home / Self Care | Payer: Self-pay | Source: Ambulatory Visit | Attending: Cardiology | Admitting: Cardiology

## 2021-11-24 DIAGNOSIS — I7121 Aneurysm of the ascending aorta, without rupture: Secondary | ICD-10-CM

## 2021-11-24 MED ORDER — IOPAMIDOL (ISOVUE-370) INJECTION 76%
75.0000 mL | Freq: Once | INTRAVENOUS | Status: AC | PRN
  Administered 2021-11-24: 75 mL via INTRAVENOUS

## 2021-12-06 ENCOUNTER — Other Ambulatory Visit: Payer: Self-pay

## 2021-12-06 DIAGNOSIS — I712 Thoracic aortic aneurysm, without rupture, unspecified: Secondary | ICD-10-CM

## 2021-12-06 DIAGNOSIS — I10 Essential (primary) hypertension: Secondary | ICD-10-CM

## 2021-12-06 DIAGNOSIS — I700 Atherosclerosis of aorta: Secondary | ICD-10-CM

## 2021-12-06 MED ORDER — ATENOLOL 50 MG PO TABS: 50.0000 mg | ORAL_TABLET | Freq: Every day | ORAL | 3 refills | Status: DC

## 2021-12-06 MED ORDER — ATORVASTATIN CALCIUM 40 MG PO TABS: 40.0000 mg | ORAL_TABLET | Freq: Every day | ORAL | 3 refills | Status: DC

## 2021-12-09 ENCOUNTER — Other Ambulatory Visit: Payer: Self-pay | Admitting: Cardiology

## 2021-12-09 ENCOUNTER — Encounter: Payer: Self-pay | Admitting: Cardiology

## 2021-12-09 DIAGNOSIS — I7121 Aneurysm of the ascending aorta, without rupture: Secondary | ICD-10-CM

## 2021-12-16 ENCOUNTER — Other Ambulatory Visit: Payer: Self-pay

## 2021-12-16 ENCOUNTER — Encounter: Payer: Self-pay | Admitting: Cardiology

## 2021-12-16 DIAGNOSIS — I10 Essential (primary) hypertension: Secondary | ICD-10-CM

## 2021-12-16 MED ORDER — LOSARTAN POTASSIUM-HCTZ 100-25 MG PO TABS: 1.0000 | ORAL_TABLET | Freq: Every day | ORAL | 3 refills | Status: DC

## 2022-01-04 ENCOUNTER — Encounter: Payer: Self-pay | Admitting: Podiatrist

## 2022-01-04 ENCOUNTER — Ambulatory Visit (INDEPENDENT_AMBULATORY_CARE_PROVIDER_SITE_OTHER): Payer: Medicare Other | Admitting: Podiatrist

## 2022-01-04 ENCOUNTER — Ambulatory Visit: Payer: Medicare Other

## 2022-01-04 DIAGNOSIS — M79672 Pain in left foot: Secondary | ICD-10-CM

## 2022-01-04 DIAGNOSIS — M7752 Other enthesopathy of left foot: Secondary | ICD-10-CM

## 2022-01-04 NOTE — Progress Notes (Signed)
Subjective: Alejandro Mccullough is a 66 y.o. male patient presents to office with complaint of moderate pain along the top and lateral side of the left ankle.   He relates the more he walks, the pain will increase the next day or two after.  Patient has treated this problem a steroid injection in May which helped for several months.  He also tried OTC inserts in his shoes with no long term relief. Denies any other pedal complaints.   Patient Active Problem List   Diagnosis Date Noted   Essential hypertension 01/09/2019   Thoracic aortic aneurysm without rupture (HCC) 01/09/2019   Aortic atherosclerosis (HCC) 01/09/2019    Current Outpatient Medications on File Prior to Visit  Medication Sig Dispense Refill   aspirin EC 81 MG tablet Take 1 tablet (81 mg total) by mouth daily. 90 tablet 3   atenolol (TENORMIN) 50 MG tablet Take 1 tablet (50 mg total) by mouth daily. 90 tablet 3   atorvastatin (LIPITOR) 40 MG tablet Take 1 tablet (40 mg total) by mouth daily. 90 tablet 3   Cholecalciferol (VITAMIN D3) 125 MCG (5000 UT) TABS Take by mouth daily.     losartan-hydrochlorothiazide (HYZAAR) 100-25 MG tablet Take 1 tablet by mouth daily. 90 tablet 3   meloxicam (MOBIC) 15 MG tablet Take 1 tablet (15 mg total) by mouth daily as needed for pain. 30 tablet 0   Methylcellulose, Laxative, (CITRUCEL) 500 MG TABS Take 2 tablets by mouth daily. Citrucel     tadalafil (CIALIS) 5 MG tablet Take 5 mg by mouth daily.     No current facility-administered medications on file prior to visit.    Allergies  Allergen Reactions   Penicillin G Rash    Objective: Physical Exam General: The patient is alert and oriented x3 in no acute distress.  Dermatology: Skin is warm, dry and supple bilateral lower extremities. Nails 1-10 are normal. There is no erythema, edema, no eccymosis, no open lesions present. Integument is otherwise unremarkable.  Vascular: Dorsalis Pedis pulse and Posterior Tibial pulse are 2/4 bilateral.  Capillary fill time is immediate to all digits.  Neurological: Grossly intact to light touch with an achilles reflex of +2/5 and a  negative Tinel's sign bilateral.  Musculoskeletal: Tenderness to palpation at the sinus tarsi on the left ankle is noted.  Pain with direct palpation is noted.  Upon standing he everts and his shoes do show a wear pattern on the outside confirming he everts when walking causing excess pressure on the outside of the ankle left greater than right.   Xrays taken today and compared to previous xrays. No changes noted.  Midfoot spurring is present and a calcaneal spur is present as well.  No sign of fracture or dislication   Assessment and Plan:   ICD-10-CM   1. Capsulitis of left ankle  M77.52 DG Foot Complete Left        -Complete examination performed.  -Xrays reviewed - discussed sinus tarsi syndrome/ capsulitis and discussed treatment options including steroid injections as well as use of a custom orthotic to try and post him in to prevent the eversion and hopefully decrease pain in the sinus tarsi area.  He would like to proceed with orthotics and was casted in a foam cast today.   - we will call when orthotics are ready for pick up. -He will consider another injection and call if he decided he would like another one/ may decide to get one prior to a big trip  coming up next year.

## 2022-02-10 ENCOUNTER — Ambulatory Visit: Payer: Self-pay | Admitting: *Deleted

## 2022-02-10 DIAGNOSIS — M7752 Other enthesopathy of left foot: Secondary | ICD-10-CM

## 2022-02-21 NOTE — Progress Notes (Signed)
Patient presents today to pick up custom molded foot orthotics recommended by Dr. Irving Shows.   Orthotics were dispensed and fit was satisfactory. Reviewed instructions for break-in and wear. Written instructions given to patient.  Patient will follow up as needed.   [facility] - order # [id number]

## 2022-03-15 ENCOUNTER — Encounter: Payer: Self-pay | Admitting: Cardiology

## 2022-03-15 NOTE — Telephone Encounter (Signed)
Pre op letter sent to Dr. Mardelle Matte, patient, and front desk  Thanks MJP

## 2022-03-15 NOTE — Telephone Encounter (Signed)
From patient.

## 2022-05-15 ENCOUNTER — Other Ambulatory Visit: Payer: Self-pay | Admitting: Urology

## 2022-05-15 DIAGNOSIS — N402 Nodular prostate without lower urinary tract symptoms: Secondary | ICD-10-CM

## 2022-06-19 ENCOUNTER — Ambulatory Visit
Admission: RE | Admit: 2022-06-19 | Discharge: 2022-06-19 | Disposition: A | Discharge status: Home / Self Care | Payer: Medicare Other | Source: Ambulatory Visit | Attending: Urology | Admitting: Urology

## 2022-06-19 DIAGNOSIS — N402 Nodular prostate without lower urinary tract symptoms: Secondary | ICD-10-CM

## 2022-06-19 MED ORDER — GADOPICLENOL 0.5 MMOL/ML IV SOLN
10.0000 mL | Freq: Once | INTRAVENOUS | Status: AC | PRN
  Administered 2022-06-19: 10 mL via INTRAVENOUS

## 2022-10-23 ENCOUNTER — Other Ambulatory Visit: Payer: Self-pay | Admitting: Cardiology

## 2022-10-23 DIAGNOSIS — I712 Thoracic aortic aneurysm, without rupture, unspecified: Secondary | ICD-10-CM

## 2022-10-23 DIAGNOSIS — I10 Essential (primary) hypertension: Secondary | ICD-10-CM

## 2022-10-27 ENCOUNTER — Other Ambulatory Visit: Payer: Medicare Other

## 2022-11-02 LAB — LIPID PANEL
Chol/HDL Ratio: 4.3 ratio (ref 0.0–5.0)
Cholesterol, Total: 139 mg/dL (ref 100–199)
HDL: 32 mg/dL — ABNORMAL LOW
LDL Chol Calc (NIH): 62 mg/dL (ref 0–99)
Triglycerides: 285 mg/dL — ABNORMAL HIGH (ref 0–149)
VLDL Cholesterol Cal: 45 mg/dL — ABNORMAL HIGH (ref 5–40)

## 2022-11-03 ENCOUNTER — Ambulatory Visit: Payer: Medicare Other | Admitting: Cardiology

## 2022-11-06 ENCOUNTER — Ambulatory Visit: Payer: Medicare Other | Admitting: Cardiology

## 2022-11-06 ENCOUNTER — Encounter: Payer: Self-pay | Admitting: Cardiology

## 2022-11-06 ENCOUNTER — Other Ambulatory Visit: Payer: Self-pay | Admitting: Cardiology

## 2022-11-06 VITALS — BP 104/57 | HR 58 | Resp 17 | Ht 70.0 in | Wt 234.0 lb

## 2022-11-06 DIAGNOSIS — I7121 Aneurysm of the ascending aorta, without rupture: Secondary | ICD-10-CM

## 2022-11-06 DIAGNOSIS — I700 Atherosclerosis of aorta: Secondary | ICD-10-CM

## 2022-11-06 DIAGNOSIS — I10 Essential (primary) hypertension: Secondary | ICD-10-CM

## 2022-11-06 NOTE — Progress Notes (Unsigned)
Patient referred by Charlane Ferretti, DO for hypertension  Subjective:   Alejandro Mccullough, male    DOB: 09/30/1955, 67 y.o.   MRN: [id number]   No chief complaint on file.   HPI   67 y.o. Caucasian male with thoracic aorta aneurysm, hypertension, aortic atherosclerosis.  Patient is doing well. He denies chest pain, shortness of breath, palpitations, leg edema, orthopnea, PND, TIA/syncope.Blood pressure is well controlled. He has not had any CT scan aorta since 2022, has next follow up with surgeons at [facility] in 2024.    Initial consultation HPI 12/2018: In 2016, patient was diagnosed with thoracic aortic aneurysm during work-up for palpitations.  He has regular follow-up with cardiothoracic surgery at [facility].  He recently underwent CT scan that showed stable ectatic thoracic aorta aneurysm measuring 4.8 cm.  Patient is a retired Recruitment consultant at [employer].  He moved from [place] to [place] in 2018. He exercises regularly, with walking, bicycle, and weight training with 20 lb dumbbells. He denies chest pain, shortness of breath, palpitations, leg edema, orthopnea, PND, TIA/syncope.    Current Outpatient Medications:    aspirin EC 81 MG tablet, Take 1 tablet (81 mg total) by mouth daily., Disp: 90 tablet, Rfl: 3   atenolol (TENORMIN) 50 MG tablet, TAKE 1 TABLET DAILY, Disp: 90 tablet, Rfl: 3   atorvastatin (LIPITOR) 40 MG tablet, TAKE 1 TABLET DAILY, Disp: 90 tablet, Rfl: 3   Cholecalciferol (VITAMIN D3) 125 MCG (5000 UT) TABS, Take by mouth daily., Disp: , Rfl:    losartan-hydrochlorothiazide (HYZAAR) 100-25 MG tablet, Take 1 tablet by mouth daily., Disp: 90 tablet, Rfl: 3   Methylcellulose, Laxative, (CITRUCEL) 500 MG TABS, Take 2 tablets by mouth daily. Citrucel, Disp: , Rfl:    tadalafil (CIALIS) 5 MG tablet, Take 5 mg by mouth daily., Disp: , Rfl:   Cardiovascular studies:  EKG 11/06/2022: Sinus rhythm 65 bpm Frequent ectopic ventricular beats   Incomplete right bundle branch block  Left anterior fascicular block Nonspecific T-abnormality Low voltage  CTA chest 11/24/2021: 1. Stable uncomplicated fusiform aneurysmal dilatation of the ascending thoracic aorta measuring 44 mm in maximal diameter, unchanged compared to the 2018 examination. Aortic aneurysm NOS (ICD10-I71.9) 2. Cardiomegaly with suspected aortic valvular calcifications as could be seen in the setting of aortic valvular pathology. Further evaluation with cardiac echo could be performed as indicated. 3. Aortic Atherosclerosis (ICD10-I70.0). 4. Punctate (3 mm) right middle lobe pulmonary nodule, unchanged since the 2018 examination and thus of benign etiology. 5. Suspected hepatic steatosis.  Correlation with LFTs is advised. 6. Cholelithiasis without evidence of cholecystitis.   Echocardiogram 11/18/2019:  Left ventricle cavity is normal in size. Mild basal septal hypertrophy.  Normal global wall motion. Normal LV systolic function with EF 59%. Normal  diastolic function.  Calculated EF 59%.  Ascending aorta dilatation not appreciated on echocardiogram due to  inadequate visualization.  Mildly calcified aortic valve. Difficult to determine number of leaflets  on available images. Trace aortic stenosis. Mean PG 8 mmHg. Mild (Grade I)  aortic regurgitation.  Mild tricuspid regurgitation.  No evidence of pulmonary hypertension.  Recent labs: 11/01/2022: Glucose 108, BUN/Cr 18/1.27. EGFR 63. Na/K 143/4.4.  Chol 139, TG 285, HDL 32, LDL 62  11/09/2020: Glucose 97, BUN/Cr 21/1.12. EGFR 74. Na/K 140/4.4.    Review of Systems  Cardiovascular:  Negative for chest pain, dyspnea on exertion, leg swelling, palpitations and syncope.         Vitals:   11/06/22 1522  BP: Marland Kitchen)  104/57  Pulse: (!) 58  Resp: 17  SpO2: 98%      Body mass index is 33.58 kg/m.   Objective:   Physical Exam Vitals and nursing note reviewed.  Constitutional:      Appearance: He  is well-developed.  Neck:     Vascular: No JVD.  Cardiovascular:     Rate and Rhythm: Normal rate and regular rhythm.     Pulses: Intact distal pulses.     Heart sounds: Normal heart sounds. No murmur heard. Pulmonary:     Effort: Pulmonary effort is normal.     Breath sounds: Normal breath sounds. No wheezing or rales.         Assessment & Recommendations:   67 y.o. Caucasian male with thoracic aorta aneurysm, hypertension, aortic atherosclerosis  *** Aneurysm of ascending aorta (HCC) 4.4 cm, stable. Conitnue medical management.  No clear family h/o or physical exam features s/o familial aortopathy. ***Repeat CT angiogram aorta  Genetic screening negative for familial aortopathy. 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  Previously offered referral to genetic counseling.  Patient would like to hold off for now.   *** Essential hypertension Well controlled. Continue losartan-HCTZ 100-25 for convenience.  Aortic atherosclerosis: Continue Aspirin, statin. Check lipid panel  F/u in 1 year  Elder Negus, MD [facility] [place] Pager: [phone] Office: [phone]

## 2022-11-07 MED ORDER — ATORVASTATIN CALCIUM 40 MG PO TABS
40.0000 mg | ORAL_TABLET | Freq: Every day | ORAL | 3 refills | Status: DC
Start: 2022-11-07 — End: 2023-11-02

## 2022-11-07 MED ORDER — ASPIRIN 81 MG PO TBEC: 81.0000 mg | DELAYED_RELEASE_TABLET | Freq: Every day | ORAL | 3 refills | Status: AC

## 2022-11-07 MED ORDER — LOSARTAN POTASSIUM-HCTZ 100-25 MG PO TABS: 1.0000 | ORAL_TABLET | Freq: Every day | ORAL | 3 refills | Status: DC

## 2022-11-07 MED ORDER — ATENOLOL 50 MG PO TABS
50.0000 mg | ORAL_TABLET | Freq: Every day | ORAL | 3 refills | Status: DC
Start: 2022-11-07 — End: 2023-12-03

## 2022-11-28 ENCOUNTER — Ambulatory Visit: Payer: Medicare Other | Admitting: Podiatry

## 2022-11-28 DIAGNOSIS — M778 Other enthesopathies, not elsewhere classified: Secondary | ICD-10-CM | POA: Diagnosis not present

## 2022-11-28 MED ORDER — MELOXICAM 7.5 MG PO TABS: 7.5000 mg | ORAL_TABLET | Freq: Every day | ORAL | 0 refills | Status: AC | PRN

## 2022-11-28 NOTE — Patient Instructions (Signed)

## 2022-11-28 NOTE — Progress Notes (Unsigned)
Subjective: No chief complaint on file.  67 year old male presents the office today requesting steroid injection.  He states the answers been somewhat helpful and the injection was done previously did help me requesting steroid injections today as his upcoming trip.  No recent injury or changes otherwise.  Objective: AAO x3, NAD DP/PT pulses palpable bilaterally, CRT less than 3 seconds Tenderness palpation of the sinus tarsi at the left side.  There is no COPD there is no erythema.  Some of discomfort in the dorsal foot just distal to the ankle joint but no area pinpoint tenderness.  Flexor, extensor tendons appear to be intact.  MMT 5/5. No pain with calf compression, swelling, warmth, erythema  Assessment: Capsulitis left side  Plan: -All treatment options discussed with the patient including all alternatives, risks, complications.  -Patient's questions sterile injection.  Verbal consent obtained.  I cleaned skin with Betadine, alcohol picture and mixture of 1 cc Kenalog 10, 0.8 cc Marcaine plain, 0.5 cc of lidocaine plain was infiltrated into the sinus tarsi without complications.  Postinjection care discussed.  Tolerated well. -Continue orthotics, supportive shoe gear. -If no improvement consider MRI or advanced imaging. -Patient encouraged to call the office with any questions, concerns, change in symptoms.   Vivi Barrack DPM

## 2023-01-11 ENCOUNTER — Other Ambulatory Visit: Payer: Self-pay | Admitting: Cardiology

## 2023-01-11 DIAGNOSIS — I10 Essential (primary) hypertension: Secondary | ICD-10-CM

## 2023-09-26 ENCOUNTER — Encounter: Payer: Self-pay | Admitting: Cardiology

## 2023-10-26 ENCOUNTER — Ambulatory Visit: Payer: Medicare Other | Admitting: Cardiology

## 2023-11-02 ENCOUNTER — Ambulatory Visit: Attending: Cardiology | Admitting: Cardiology

## 2023-11-02 ENCOUNTER — Encounter: Payer: Self-pay | Admitting: Cardiology

## 2023-11-02 VITALS — BP 100/54 | HR 56 | Ht 70.0 in | Wt 235.6 lb

## 2023-11-02 DIAGNOSIS — I10 Essential (primary) hypertension: Secondary | ICD-10-CM | POA: Insufficient documentation

## 2023-11-02 DIAGNOSIS — E782 Mixed hyperlipidemia: Secondary | ICD-10-CM | POA: Insufficient documentation

## 2023-11-02 DIAGNOSIS — I700 Atherosclerosis of aorta: Secondary | ICD-10-CM | POA: Diagnosis present

## 2023-11-02 DIAGNOSIS — I7121 Aneurysm of the ascending aorta, without rupture: Secondary | ICD-10-CM | POA: Diagnosis present

## 2023-11-02 MED ORDER — ATORVASTATIN CALCIUM 80 MG PO TABS
80.0000 mg | ORAL_TABLET | Freq: Every day | ORAL | 3 refills | Status: DC
Start: 2023-11-02 — End: 2023-11-20

## 2023-11-02 NOTE — Patient Instructions (Signed)
 Medication Instructions:  INCREASE Lipitor to 80 mg daily *If you need a refill on your cardiac medications before your next appointment, please call your pharmacy*  Lab Work: FASTING LIPID PANEL IN [place]   If you have labs (blood work) drawn today and your tests are completely normal, you will receive your results only by: MyChart Message (if you have MyChart) OR A paper copy in the mail If you have any lab test that is abnormal or we need to change your treatment, we will call you to review the results.  Testing/Procedures: NONE  Follow-Up: At [facility], you and your health needs are our priority.  As part of our continuing mission to provide you with exceptional heart care, our providers are all part of one team.  This team includes your primary Cardiologist (physician) and Advanced Practice Providers or APPs (Physician Assistants and Nurse Practitioners) who all work together to provide you with the care you need, when you need it.  Your next appointment:   1 YEAR  Provider:   Newman JINNY Lawrence, MD

## 2023-11-02 NOTE — Progress Notes (Signed)
 Cardiology Office Note:  .   Date:  11/02/2023  ID:  Alejandro Mccullough, DOB November 04, 1955, MRN [id number] PCP: Valentin Skates, DO  [facility] Providers Cardiologist:  Newman Lawrence, MD PCP: Valentin Skates, DO  Chief Complaint  Patient presents with   Aneurysm of ascending aorta without rupture   Hypertension     Alejandro Mccullough is a 68 y.o. male with hypertension, TAA, aortic atherosclerosis  Discussed the use of AI scribe software for clinical note transcription with the patient, who gave verbal consent to proceed.  History of Present Illness   CTA aorta in 05/2023 showed 4.6 x 4.7 cm fusiform ascending aorta aneurysm.  It does not appear that he was seen by cardiothoracic surgery at [facility] after this CT scan.  Patient was seen by pulmonologist Dr. Otilia at [facility] given abnormal CT chest findings.  These were deemed to be low clinical suspicion for malignancy.  Serial PFT and chest CT of the neck several years were recommended.  Patient stays active with regular walking, does not lift any heavy weights.  Denies any complaints of chest pain, shortness of breath.   Vitals:   11/02/23 1303  BP: (!) 100/54  Pulse: (!) 56  SpO2: 94%      Review of Systems  Cardiovascular:  Negative for chest pain, dyspnea on exertion, leg swelling, palpitations and syncope.        Studies Reviewed: SABRA        EKG 11/02/2023: Sinus bradycardia Left axis deviation Incomplete right bundle branch block Possible Anterior infarct , age undetermined No previous ECGs available  CTA chest 05/2023 ([facility]): Fusiform dilatation of ascending thoracic aorta measures approximately 4.7 x 4.6 cm, unchanged from prior exam. No intramural hematoma. Atherosclerotic disease of aorta and cervical branches as described.   Mildly improved bronchocentric groundglass opacity in the left lower lobe, likely related to improving infectious/inflammatory changes.   Several scattered punctate  lung nodules. Most are unchanged pre-existing nodules. Some nodules are new from prior exam. Attention on a follow-up exam.   Labs 02/2023: Chol 157, TG 234, HDL 39, LDL 71   Physical Exam Vitals and nursing note reviewed.  Constitutional:      General: He is not in acute distress. Neck:     Vascular: No JVD.  Cardiovascular:     Rate and Rhythm: Normal rate and regular rhythm.     Heart sounds: Normal heart sounds. No murmur heard. Pulmonary:     Effort: Pulmonary effort is normal.     Breath sounds: Normal breath sounds. No wheezing or rales.  Musculoskeletal:     Right lower leg: No edema.     Left lower leg: No edema.      VISIT DIAGNOSES:   ICD-10-CM   1. Aneurysm of ascending aorta without rupture (HCC)  I71.21 EKG 12-Lead    2. Aortic atherosclerosis (HCC)  I70.0 EKG 12-Lead    atorvastatin  (LIPITOR) 80 MG tablet    3. Essential hypertension  I10 EKG 12-Lead    4. Mixed hyperlipidemia  E78.2 Lipid panel    Lipid panel       Alejandro Mccullough is a 68 y.o. male with hypertension, TAA, aortic atherosclerosis  Assessment & Plan  Aneurysm of ascending aorta: 4.7 x 4.6 cm fusiform ascending aorta dilatation and 05/2023. Recommend discussing with CT surgery at [facility], will need continued surveillance. Genetic screening negative for familial aortopathy. 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  Previously offered referral to genetic  counseling.  Patient would like to hold off for now.  HR, BP well controlled on losartan -HCTZ and metoprolol   Aortic atherosclerosis: Continue Aspirin , statin.   Elevated TG: Increase Lipitor to 80 mg daily.  Check fasting lipid panel in 12/2023. If TG remain >200, consider adding fenofibrate.    Meds ordered this encounter  Medications   atorvastatin  (LIPITOR) 80 MG tablet    Sig: Take 1 tablet (80 mg total) by mouth daily.    Dispense:  90 tablet    Refill:  3     F/u in 1 year  Signed, Newman JINNY Lawrence, MD

## 2023-11-20 ENCOUNTER — Other Ambulatory Visit: Payer: Self-pay

## 2023-11-20 DIAGNOSIS — I700 Atherosclerosis of aorta: Secondary | ICD-10-CM

## 2023-11-20 MED ORDER — ATORVASTATIN CALCIUM 80 MG PO TABS: 80.0000 mg | ORAL_TABLET | Freq: Every day | ORAL | 3 refills | Status: AC

## 2023-12-03 ENCOUNTER — Other Ambulatory Visit: Payer: Self-pay | Admitting: Cardiology

## 2023-12-03 DIAGNOSIS — I10 Essential (primary) hypertension: Secondary | ICD-10-CM

## 2024-01-07 ENCOUNTER — Other Ambulatory Visit: Payer: Self-pay | Admitting: Cardiology

## 2024-01-07 DIAGNOSIS — I10 Essential (primary) hypertension: Secondary | ICD-10-CM

## 2024-01-31 ENCOUNTER — Ambulatory Visit: Payer: Self-pay | Admitting: Cardiology

## 2024-01-31 DIAGNOSIS — E782 Mixed hyperlipidemia: Secondary | ICD-10-CM

## 2024-01-31 LAB — LIPID PANEL
Chol/HDL Ratio: 4.4 ratio (ref 0.0–5.0)
Cholesterol, Total: 163 mg/dL (ref 100–199)
HDL: 37 mg/dL — ABNORMAL LOW (ref >39)
LDL Chol Calc (NIH): 93 mg/dL (ref 0–99)
Triglycerides: 189 mg/dL — ABNORMAL HIGH (ref 0–149)
VLDL Cholesterol Cal: 33 mg/dL (ref 5–40)

## 2024-01-31 NOTE — Progress Notes (Signed)
 LDL in 93, up from 62 in 2024.  Please check if patient is taking Lipitor 80 mg daily.  If yes, could consider diet and lifestyle changes, along with possibly adding Zetia 10 mg daily.  Repeat lipid panel in 3 months.   Thanks MJP
